# Patient Record
Sex: Female | Born: 1935 | Race: White | Hispanic: No | Smoking: Never smoker
Health system: Southern US, Community
[De-identification: ages and names within clinical notes are randomized; demographics above are authoritative.]

## PROBLEM LIST (undated history)

## (undated) DIAGNOSIS — D509 Iron deficiency anemia, unspecified: Secondary | ICD-10-CM

## (undated) DIAGNOSIS — Z9889 Other specified postprocedural states: Secondary | ICD-10-CM

## (undated) DIAGNOSIS — C801 Malignant (primary) neoplasm, unspecified: Secondary | ICD-10-CM

## (undated) DIAGNOSIS — R112 Nausea with vomiting, unspecified: Secondary | ICD-10-CM

## (undated) DIAGNOSIS — G629 Polyneuropathy, unspecified: Secondary | ICD-10-CM

## (undated) DIAGNOSIS — E538 Deficiency of other specified B group vitamins: Secondary | ICD-10-CM

## (undated) DIAGNOSIS — I10 Essential (primary) hypertension: Secondary | ICD-10-CM

## (undated) DIAGNOSIS — T8859XA Other complications of anesthesia, initial encounter: Secondary | ICD-10-CM

## (undated) DIAGNOSIS — E039 Hypothyroidism, unspecified: Secondary | ICD-10-CM

## (undated) DIAGNOSIS — T4145XA Adverse effect of unspecified anesthetic, initial encounter: Secondary | ICD-10-CM

## (undated) DIAGNOSIS — M1990 Unspecified osteoarthritis, unspecified site: Secondary | ICD-10-CM

## (undated) DIAGNOSIS — E0590 Thyrotoxicosis, unspecified without thyrotoxic crisis or storm: Secondary | ICD-10-CM

## (undated) HISTORY — PX: ABDOMINAL HYSTERECTOMY: SHX81

## (undated) HISTORY — PX: MENISCUS REPAIR: SHX5179

## (undated) HISTORY — PX: COLONOSCOPY: SHX174

## (undated) HISTORY — PX: TONSILLECTOMY: SUR1361

---

## 1999-02-08 ENCOUNTER — Encounter: Admission: RE | Admit: 1999-02-08 | Discharge: 1999-02-08 | Payer: Self-pay | Admitting: Gynecology

## 1999-02-08 ENCOUNTER — Encounter: Payer: Self-pay | Admitting: Gynecology

## 2000-07-03 ENCOUNTER — Encounter: Admission: RE | Admit: 2000-07-03 | Discharge: 2000-07-03 | Payer: Self-pay | Admitting: Gynecology

## 2000-07-03 ENCOUNTER — Encounter: Payer: Self-pay | Admitting: Gynecology

## 2000-07-09 ENCOUNTER — Encounter: Payer: Self-pay | Admitting: Gynecology

## 2000-07-09 ENCOUNTER — Encounter: Admission: RE | Admit: 2000-07-09 | Discharge: 2000-07-09 | Payer: Self-pay | Admitting: Gynecology

## 2000-09-03 ENCOUNTER — Other Ambulatory Visit: Admission: RE | Admit: 2000-09-03 | Discharge: 2000-09-03 | Payer: Self-pay | Admitting: Gynecology

## 2001-01-23 ENCOUNTER — Encounter: Admission: RE | Admit: 2001-01-23 | Discharge: 2001-01-23 | Payer: Self-pay | Admitting: Gynecology

## 2001-01-23 ENCOUNTER — Encounter: Payer: Self-pay | Admitting: Gynecology

## 2002-08-14 ENCOUNTER — Encounter: Payer: Self-pay | Admitting: Gynecology

## 2002-08-14 ENCOUNTER — Encounter: Admission: RE | Admit: 2002-08-14 | Discharge: 2002-08-14 | Payer: Self-pay | Admitting: Gynecology

## 2004-02-23 ENCOUNTER — Encounter: Admission: RE | Admit: 2004-02-23 | Discharge: 2004-02-23 | Payer: Self-pay | Admitting: Gynecology

## 2005-05-03 ENCOUNTER — Other Ambulatory Visit: Admission: RE | Admit: 2005-05-03 | Discharge: 2005-05-03 | Payer: Self-pay | Admitting: Gynecology

## 2010-05-19 ENCOUNTER — Other Ambulatory Visit (HOSPITAL_COMMUNITY): Payer: Self-pay | Admitting: Neurosurgery

## 2010-05-19 ENCOUNTER — Ambulatory Visit (HOSPITAL_COMMUNITY)
Admission: RE | Admit: 2010-05-19 | Discharge: 2010-05-19 | Disposition: A | Discharge status: Home / Self Care | Payer: Medicare Other | Source: Ambulatory Visit | Attending: Neurosurgery | Admitting: Neurosurgery

## 2010-05-19 ENCOUNTER — Encounter (HOSPITAL_COMMUNITY)
Admission: RE | Admit: 2010-05-19 | Discharge: 2010-05-19 | Disposition: A | Discharge status: Home / Self Care | Payer: Medicare Other | Source: Ambulatory Visit | Attending: Neurosurgery | Admitting: Neurosurgery

## 2010-05-19 DIAGNOSIS — M5126 Other intervertebral disc displacement, lumbar region: Secondary | ICD-10-CM

## 2010-05-19 DIAGNOSIS — J984 Other disorders of lung: Secondary | ICD-10-CM | POA: Insufficient documentation

## 2010-05-19 DIAGNOSIS — Z01812 Encounter for preprocedural laboratory examination: Secondary | ICD-10-CM | POA: Insufficient documentation

## 2010-05-19 DIAGNOSIS — Z01811 Encounter for preprocedural respiratory examination: Secondary | ICD-10-CM | POA: Insufficient documentation

## 2010-05-19 DIAGNOSIS — Z01818 Encounter for other preprocedural examination: Secondary | ICD-10-CM | POA: Insufficient documentation

## 2010-05-19 LAB — BASIC METABOLIC PANEL
BUN: 16 mg/dL (ref 6–23)
CO2: 30 mEq/L (ref 19–32)
Chloride: 99 mEq/L (ref 96–112)
GFR calc Af Amer: 55 mL/min — ABNORMAL LOW (ref >60)
Potassium: 4.8 mEq/L (ref 3.5–5.1)

## 2010-05-19 LAB — CBC
Hemoglobin: 12.4 g/dL (ref 12.0–15.0)
MCV: 93.1 fL (ref 78.0–100.0)
Platelets: 289 10*3/uL (ref 150–400)
RBC: 4.05 MIL/uL (ref 3.87–5.11)
WBC: 5 10*3/uL (ref 4.0–10.5)

## 2010-05-19 LAB — SURGICAL PCR SCREEN
MRSA, PCR: NEGATIVE
Staphylococcus aureus: NEGATIVE

## 2010-05-24 ENCOUNTER — Inpatient Hospital Stay (HOSPITAL_COMMUNITY): Payer: Medicare Other

## 2010-05-24 ENCOUNTER — Inpatient Hospital Stay (HOSPITAL_COMMUNITY)
Admission: RE | Admit: 2010-05-24 | Discharge: 2010-05-25 | DRG: 491 | Disposition: A | Discharge status: Home / Self Care | Payer: Medicare Other | Source: Ambulatory Visit | Attending: Neurosurgery | Admitting: Neurosurgery

## 2010-05-24 DIAGNOSIS — M5126 Other intervertebral disc displacement, lumbar region: Principal | ICD-10-CM | POA: Diagnosis present

## 2010-05-24 DIAGNOSIS — E039 Hypothyroidism, unspecified: Secondary | ICD-10-CM | POA: Diagnosis present

## 2010-07-05 NOTE — Op Note (Signed)
[name], Patricia Mason                ACCOUNT NO.:  [id number]  MEDICAL RECORD NO.:  [id number]           PATIENT TYPE:  I  LOCATION:  3535                         FACILITY:  [facility]  PHYSICIAN:  Donalee Citrin, M.D.        DATE OF BIRTH:  1930-09-02  DATE OF PROCEDURE:  05/24/2010 DATE OF DISCHARGE:  05/25/2010                              OPERATIVE REPORT   PREOPERATIVE DIAGNOSIS:  Right L5 radiculopathy from ruptured disk at L4- 5, right.  PROCEDURE:  Lumbar laminectomy and microdiskectomy at L4-5, right; microscopic dissection of the right L5 nerve root and microscopic diskectomy.  SURGEON:  Donalee Citrin, MD  ANESTHESIA:  General endotracheal.  HISTORY OF PRESENT ILLNESS:  The patient is a very pleasant 77 year old female who has had progressive worsening back and right leg pain, radiating down the back of her leg and big toe consistent with an L5 nerve pattern.  MRI scan showed a large ruptured disk at L4-5 displacing the right L5 nerve root.  Due to the patient's failure of conservative treatment, the patient was recommended laminectomy and microdiskectomy. The risks and benefits of the operation were explained to the patient. The patient understood and agreed to proceed forward.  The patient was brought to the OR, induced general anesthesia, positioned prone on the Wilson frame.  Back was prepped and draped in the usual sterile fashion.  Preop x-rays localized the appropriate level.  So after infiltration of 10 mL of lidocaine with epinephrine, a midline incision was made and Bovie electrocautery was used to take down to the subcutaneous tissues.  Subperiosteal dissection was carried to lamina of L4-L5 on the right side.  Intraoperative x-ray identified the appropriate level, so the inferior aspect to the L4 medial facet complex and superior aspect of L5 was drilled down.  The undersurface of the lamina and medial facet complex were underbitten with a 2- to 3-mm Kerrison punch  exposing the ligamentum flavum, which was noted to be markedly hypertrophied.  This was all removed in a piecemeal fashion exposing thecal sac and the operative microscope was draped and brought into the field under microscope illumination.  The undersurface of the medial __________ was further underbitten to gain access to the proximal L5 nerve root.  The L5 nerve was then dissected off the disk space. There was a large disk fragment immediately appreciated __________ contained within the ligament and this was all removed in piecemeal fashion.  Disk space was __________.  Disk space was cleaned out decompressing the central canal as well as the L5 nerve root.  At the end of the diskectomy, there was no further stenosis.  The wound was copiously irrigated and good hemostasis was maintained.  The foramina was re-explored with an angled nerve hook and hockey stick and noted to be widely patent.  Then, Gelfoam was laid on top of the dura.  The muscle fascia was reapproximated in layers with interrupted Vicryl and skin was closed with running 4-0 subcuticular. Benzoin and Steri-Strips were applied.  The patient went to recovery room in stable condition.          ______________________________  Donalee Citrin, M.D.     GC/MEDQ  D:  06/22/2010  T:  06/23/2010  Job:  [id number]  Electronically Signed by Donalee Citrin M.D. on 07/05/2010 05:16:48 PM

## 2010-07-05 NOTE — Discharge Summary (Signed)
  Patricia Mason, Patricia Mason                ACCOUNT NO.:  [id number]  MEDICAL RECORD NO.:  [id number]           PATIENT TYPE:  I  LOCATION:  3535                         FACILITY:  [facility]  PHYSICIAN:  Donalee Citrin, M.D.        DATE OF BIRTH:  December 03, 1930  DATE OF ADMISSION:  05/24/2010 DATE OF DISCHARGE:  05/25/2010                              DISCHARGE SUMMARY   ADMITTING DIAGNOSIS:  Lumbar radiculopathy from ruptured disk at L4-5.  PROCEDURE:  Lumbar laminectomy and microdiskectomy at L4-5 and microscopic dissection of the left L5 nerve root.  HOSPITAL COURSE:  The patient was admitted as an EMA, went to the operating room and underwent the aforementioned procedure. Postoperatively, the patient did very well and went to recovery room floor.  On the floor, the patient convalesced well and was ambulating and voiding spontaneously.  On postop day #1, wound was clean and dry. She was tolerating pills for pain and she was significantly improved from preop, so the patient was able to be discharged home with scheduled followup in approximately 2 weeks.          ______________________________ Donalee Citrin, M.D.     GC/MEDQ  D:  06/22/2010  T:  06/23/2010  Job:  [id number]  Electronically Signed by Donalee Citrin M.D. on 07/05/2010 05:16:46 PM

## 2012-05-09 IMAGING — CR DG CHEST 2V
2 series · 2 of 2 positions shown · non-contrast
Comparison: None.

CLINICAL DATA: Preoperative respiratory evaluation.

CHEST - 2 VIEW

[view not recorded (1 of 2)]
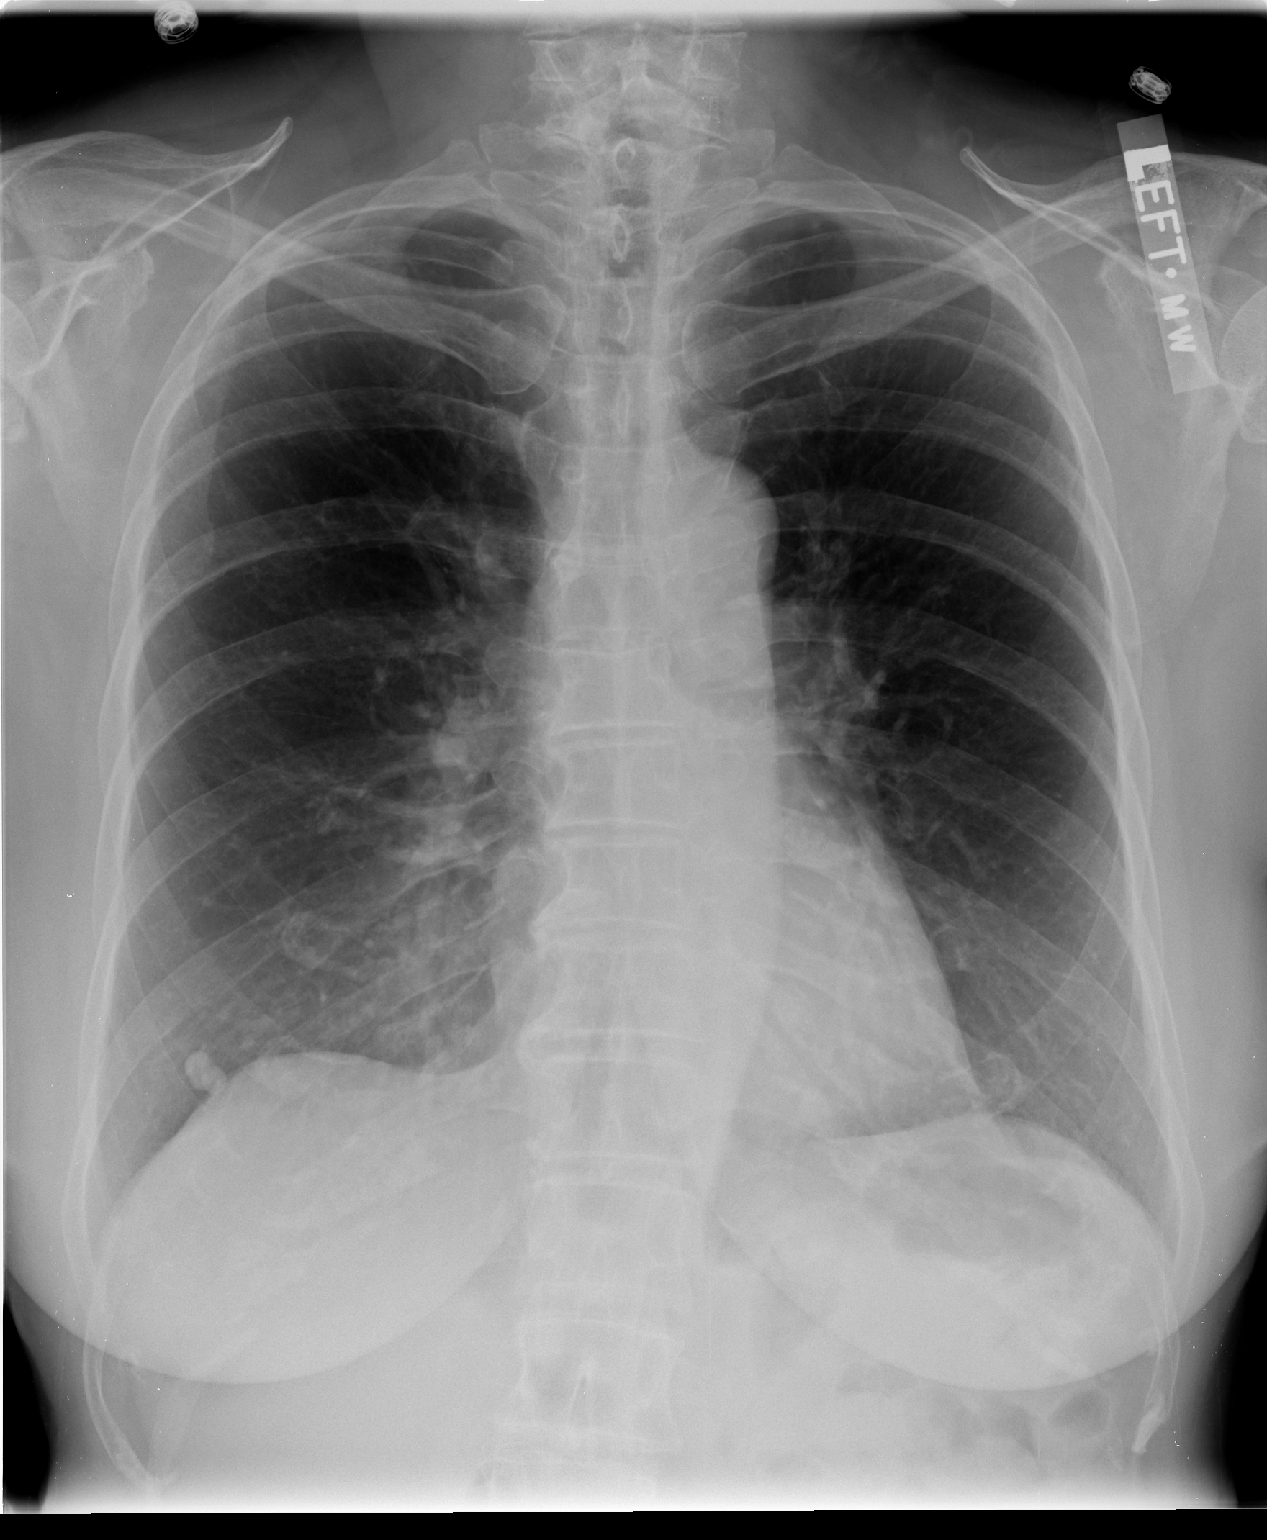

[view not recorded (2 of 2)]
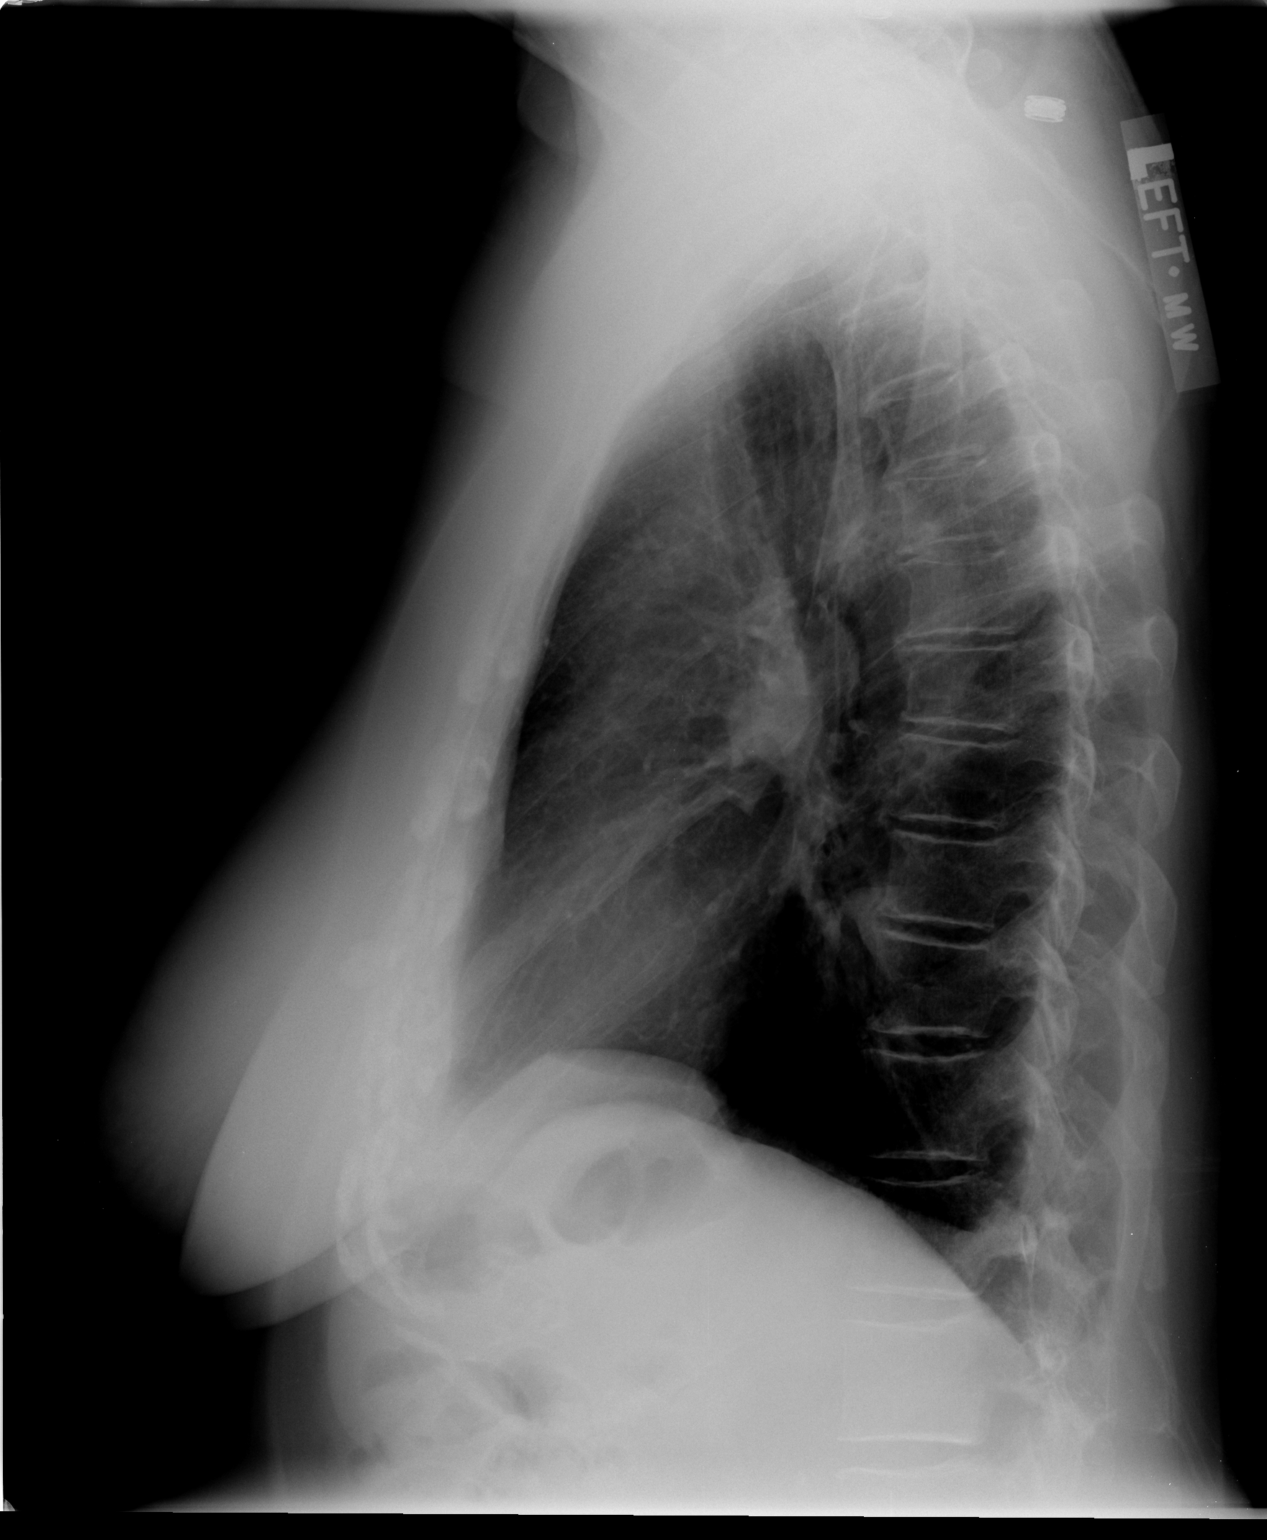

[2 of 2 positions shown; findings below may reference images not displayed]

FINDINGS: The lungs are mildly hyperexpanded.  There is no focal
airspace consolidation or pulmonary edema.  Round calcified nodules
overlying the right lung base on the frontal film are seen to
reside within the tissue of the right breast on the lateral film.
The cardiopericardial silhouette is within normal limits for size.
Imaged bony structures of the thorax are intact.
IMPRESSION: No acute cardiopulmonary findings.

## 2013-12-10 ENCOUNTER — Ambulatory Visit (HOSPITAL_COMMUNITY)
Admission: RE | Admit: 2013-12-10 | Discharge: 2013-12-10 | Disposition: A | Discharge status: Home / Self Care | Payer: Medicare Other | Source: Ambulatory Visit | Attending: Vascular Surgery | Admitting: Vascular Surgery

## 2013-12-10 ENCOUNTER — Other Ambulatory Visit (HOSPITAL_COMMUNITY): Payer: Self-pay | Admitting: Neurosurgery

## 2013-12-10 DIAGNOSIS — M79606 Pain in leg, unspecified: Secondary | ICD-10-CM

## 2013-12-10 DIAGNOSIS — R209 Unspecified disturbances of skin sensation: Secondary | ICD-10-CM | POA: Diagnosis not present

## 2013-12-10 DIAGNOSIS — M79609 Pain in unspecified limb: Secondary | ICD-10-CM | POA: Insufficient documentation

## 2015-04-19 DIAGNOSIS — H25813 Combined forms of age-related cataract, bilateral: Secondary | ICD-10-CM | POA: Diagnosis not present

## 2015-06-16 DIAGNOSIS — M170 Bilateral primary osteoarthritis of knee: Secondary | ICD-10-CM | POA: Diagnosis not present

## 2015-08-30 DIAGNOSIS — G629 Polyneuropathy, unspecified: Secondary | ICD-10-CM | POA: Diagnosis not present

## 2015-08-30 DIAGNOSIS — Z79899 Other long term (current) drug therapy: Secondary | ICD-10-CM | POA: Diagnosis not present

## 2015-08-30 DIAGNOSIS — E039 Hypothyroidism, unspecified: Secondary | ICD-10-CM | POA: Diagnosis not present

## 2015-08-30 DIAGNOSIS — I10 Essential (primary) hypertension: Secondary | ICD-10-CM | POA: Diagnosis not present

## 2015-09-13 DIAGNOSIS — R42 Dizziness and giddiness: Secondary | ICD-10-CM | POA: Diagnosis not present

## 2015-09-13 DIAGNOSIS — R070 Pain in throat: Secondary | ICD-10-CM | POA: Diagnosis not present

## 2015-09-13 DIAGNOSIS — R0982 Postnasal drip: Secondary | ICD-10-CM | POA: Diagnosis not present

## 2015-09-13 DIAGNOSIS — H9319 Tinnitus, unspecified ear: Secondary | ICD-10-CM | POA: Diagnosis not present

## 2015-09-23 DIAGNOSIS — G608 Other hereditary and idiopathic neuropathies: Secondary | ICD-10-CM | POA: Diagnosis not present

## 2015-10-17 DIAGNOSIS — H353131 Nonexudative age-related macular degeneration, bilateral, early dry stage: Secondary | ICD-10-CM | POA: Diagnosis not present

## 2015-10-17 DIAGNOSIS — H5203 Hypermetropia, bilateral: Secondary | ICD-10-CM | POA: Diagnosis not present

## 2015-10-17 DIAGNOSIS — H52223 Regular astigmatism, bilateral: Secondary | ICD-10-CM | POA: Diagnosis not present

## 2015-10-17 DIAGNOSIS — H524 Presbyopia: Secondary | ICD-10-CM | POA: Diagnosis not present

## 2015-10-17 DIAGNOSIS — H25813 Combined forms of age-related cataract, bilateral: Secondary | ICD-10-CM | POA: Diagnosis not present

## 2015-10-17 DIAGNOSIS — H35342 Macular cyst, hole, or pseudohole, left eye: Secondary | ICD-10-CM | POA: Diagnosis not present

## 2015-10-17 DIAGNOSIS — I10 Essential (primary) hypertension: Secondary | ICD-10-CM | POA: Diagnosis not present

## 2015-10-24 DIAGNOSIS — G8929 Other chronic pain: Secondary | ICD-10-CM | POA: Diagnosis not present

## 2015-10-24 DIAGNOSIS — M25561 Pain in right knee: Secondary | ICD-10-CM | POA: Diagnosis not present

## 2015-10-24 DIAGNOSIS — M25562 Pain in left knee: Secondary | ICD-10-CM | POA: Diagnosis not present

## 2015-12-13 DIAGNOSIS — L570 Actinic keratosis: Secondary | ICD-10-CM | POA: Diagnosis not present

## 2015-12-15 DIAGNOSIS — Z79899 Other long term (current) drug therapy: Secondary | ICD-10-CM | POA: Diagnosis not present

## 2015-12-16 DIAGNOSIS — Z1231 Encounter for screening mammogram for malignant neoplasm of breast: Secondary | ICD-10-CM | POA: Diagnosis not present

## 2015-12-30 DIAGNOSIS — H25811 Combined forms of age-related cataract, right eye: Secondary | ICD-10-CM | POA: Diagnosis not present

## 2015-12-30 DIAGNOSIS — H35372 Puckering of macula, left eye: Secondary | ICD-10-CM | POA: Diagnosis not present

## 2016-01-02 DIAGNOSIS — G608 Other hereditary and idiopathic neuropathies: Secondary | ICD-10-CM | POA: Diagnosis not present

## 2016-01-31 DIAGNOSIS — H25811 Combined forms of age-related cataract, right eye: Secondary | ICD-10-CM | POA: Diagnosis not present

## 2016-01-31 DIAGNOSIS — Z79899 Other long term (current) drug therapy: Secondary | ICD-10-CM | POA: Diagnosis not present

## 2016-01-31 DIAGNOSIS — Z8679 Personal history of other diseases of the circulatory system: Secondary | ICD-10-CM | POA: Diagnosis not present

## 2016-01-31 DIAGNOSIS — Z961 Presence of intraocular lens: Secondary | ICD-10-CM | POA: Diagnosis not present

## 2016-01-31 DIAGNOSIS — Z7982 Long term (current) use of aspirin: Secondary | ICD-10-CM | POA: Diagnosis not present

## 2016-01-31 DIAGNOSIS — I10 Essential (primary) hypertension: Secondary | ICD-10-CM | POA: Diagnosis not present

## 2016-01-31 DIAGNOSIS — H20011 Primary iridocyclitis, right eye: Secondary | ICD-10-CM | POA: Diagnosis not present

## 2016-01-31 DIAGNOSIS — E039 Hypothyroidism, unspecified: Secondary | ICD-10-CM | POA: Diagnosis not present

## 2016-01-31 DIAGNOSIS — H18231 Secondary corneal edema, right eye: Secondary | ICD-10-CM | POA: Diagnosis not present

## 2016-02-28 DIAGNOSIS — I10 Essential (primary) hypertension: Secondary | ICD-10-CM | POA: Diagnosis not present

## 2016-02-28 DIAGNOSIS — Z961 Presence of intraocular lens: Secondary | ICD-10-CM | POA: Diagnosis not present

## 2016-02-28 DIAGNOSIS — E039 Hypothyroidism, unspecified: Secondary | ICD-10-CM | POA: Diagnosis not present

## 2016-02-28 DIAGNOSIS — Z79899 Other long term (current) drug therapy: Secondary | ICD-10-CM | POA: Diagnosis not present

## 2016-02-28 DIAGNOSIS — Z9841 Cataract extraction status, right eye: Secondary | ICD-10-CM | POA: Diagnosis not present

## 2016-02-28 DIAGNOSIS — Z9842 Cataract extraction status, left eye: Secondary | ICD-10-CM | POA: Diagnosis not present

## 2016-02-28 DIAGNOSIS — Z7982 Long term (current) use of aspirin: Secondary | ICD-10-CM | POA: Diagnosis not present

## 2016-02-28 DIAGNOSIS — H25811 Combined forms of age-related cataract, right eye: Secondary | ICD-10-CM | POA: Diagnosis not present

## 2016-02-28 DIAGNOSIS — G629 Polyneuropathy, unspecified: Secondary | ICD-10-CM | POA: Diagnosis not present

## 2016-02-28 DIAGNOSIS — H25812 Combined forms of age-related cataract, left eye: Secondary | ICD-10-CM | POA: Diagnosis not present

## 2016-03-14 DIAGNOSIS — L820 Inflamed seborrheic keratosis: Secondary | ICD-10-CM | POA: Diagnosis not present

## 2016-03-19 HISTORY — PX: SQUAMOUS CELL CARCINOMA EXCISION: SHX2433

## 2016-03-19 HISTORY — PX: EYE SURGERY: SHX253

## 2016-05-10 DIAGNOSIS — C44622 Squamous cell carcinoma of skin of right upper limb, including shoulder: Secondary | ICD-10-CM | POA: Diagnosis not present

## 2016-05-21 DIAGNOSIS — J40 Bronchitis, not specified as acute or chronic: Secondary | ICD-10-CM | POA: Diagnosis not present

## 2016-05-21 DIAGNOSIS — G629 Polyneuropathy, unspecified: Secondary | ICD-10-CM | POA: Diagnosis not present

## 2016-05-21 DIAGNOSIS — E039 Hypothyroidism, unspecified: Secondary | ICD-10-CM | POA: Diagnosis not present

## 2016-05-21 DIAGNOSIS — I10 Essential (primary) hypertension: Secondary | ICD-10-CM | POA: Diagnosis not present

## 2016-05-21 DIAGNOSIS — Z79899 Other long term (current) drug therapy: Secondary | ICD-10-CM | POA: Diagnosis not present

## 2016-05-25 DIAGNOSIS — J22 Unspecified acute lower respiratory infection: Secondary | ICD-10-CM | POA: Diagnosis not present

## 2016-06-12 DIAGNOSIS — D485 Neoplasm of uncertain behavior of skin: Secondary | ICD-10-CM | POA: Diagnosis not present

## 2016-06-12 DIAGNOSIS — C44622 Squamous cell carcinoma of skin of right upper limb, including shoulder: Secondary | ICD-10-CM | POA: Diagnosis not present

## 2016-07-09 DIAGNOSIS — G608 Other hereditary and idiopathic neuropathies: Secondary | ICD-10-CM | POA: Diagnosis not present

## 2016-07-11 DIAGNOSIS — C44622 Squamous cell carcinoma of skin of right upper limb, including shoulder: Secondary | ICD-10-CM | POA: Diagnosis not present

## 2016-07-30 DIAGNOSIS — G8929 Other chronic pain: Secondary | ICD-10-CM | POA: Diagnosis not present

## 2016-07-30 DIAGNOSIS — M170 Bilateral primary osteoarthritis of knee: Secondary | ICD-10-CM | POA: Diagnosis not present

## 2016-09-04 DIAGNOSIS — L570 Actinic keratosis: Secondary | ICD-10-CM | POA: Diagnosis not present

## 2016-11-21 DIAGNOSIS — E039 Hypothyroidism, unspecified: Secondary | ICD-10-CM | POA: Diagnosis not present

## 2016-11-21 DIAGNOSIS — I10 Essential (primary) hypertension: Secondary | ICD-10-CM | POA: Diagnosis not present

## 2016-11-21 DIAGNOSIS — R7309 Other abnormal glucose: Secondary | ICD-10-CM | POA: Diagnosis not present

## 2016-11-21 DIAGNOSIS — R413 Other amnesia: Secondary | ICD-10-CM | POA: Diagnosis not present

## 2016-11-21 DIAGNOSIS — Z79899 Other long term (current) drug therapy: Secondary | ICD-10-CM | POA: Diagnosis not present

## 2016-11-21 DIAGNOSIS — G629 Polyneuropathy, unspecified: Secondary | ICD-10-CM | POA: Diagnosis not present

## 2017-01-02 DIAGNOSIS — R413 Other amnesia: Secondary | ICD-10-CM | POA: Diagnosis not present

## 2017-01-02 DIAGNOSIS — F5101 Primary insomnia: Secondary | ICD-10-CM | POA: Diagnosis not present

## 2017-01-29 DIAGNOSIS — G8929 Other chronic pain: Secondary | ICD-10-CM | POA: Diagnosis not present

## 2017-01-29 DIAGNOSIS — M170 Bilateral primary osteoarthritis of knee: Secondary | ICD-10-CM | POA: Diagnosis not present

## 2017-02-05 DIAGNOSIS — L570 Actinic keratosis: Secondary | ICD-10-CM | POA: Diagnosis not present

## 2017-02-20 DIAGNOSIS — Z9841 Cataract extraction status, right eye: Secondary | ICD-10-CM | POA: Diagnosis not present

## 2017-02-20 DIAGNOSIS — H353131 Nonexudative age-related macular degeneration, bilateral, early dry stage: Secondary | ICD-10-CM | POA: Diagnosis not present

## 2017-02-20 DIAGNOSIS — Z961 Presence of intraocular lens: Secondary | ICD-10-CM | POA: Diagnosis not present

## 2017-02-20 DIAGNOSIS — H52223 Regular astigmatism, bilateral: Secondary | ICD-10-CM | POA: Diagnosis not present

## 2017-02-20 DIAGNOSIS — H524 Presbyopia: Secondary | ICD-10-CM | POA: Diagnosis not present

## 2017-02-20 DIAGNOSIS — H26492 Other secondary cataract, left eye: Secondary | ICD-10-CM | POA: Diagnosis not present

## 2017-02-20 DIAGNOSIS — I10 Essential (primary) hypertension: Secondary | ICD-10-CM | POA: Diagnosis not present

## 2017-05-02 DIAGNOSIS — L570 Actinic keratosis: Secondary | ICD-10-CM | POA: Diagnosis not present

## 2017-05-07 DIAGNOSIS — M7989 Other specified soft tissue disorders: Secondary | ICD-10-CM | POA: Diagnosis not present

## 2017-05-07 DIAGNOSIS — S6991XA Unspecified injury of right wrist, hand and finger(s), initial encounter: Secondary | ICD-10-CM | POA: Diagnosis not present

## 2017-05-07 DIAGNOSIS — I10 Essential (primary) hypertension: Secondary | ICD-10-CM | POA: Diagnosis not present

## 2017-05-07 DIAGNOSIS — F5101 Primary insomnia: Secondary | ICD-10-CM | POA: Diagnosis not present

## 2017-05-07 DIAGNOSIS — M25531 Pain in right wrist: Secondary | ICD-10-CM | POA: Diagnosis not present

## 2017-05-07 DIAGNOSIS — S63004A Unspecified dislocation of right wrist and hand, initial encounter: Secondary | ICD-10-CM | POA: Diagnosis not present

## 2017-05-07 DIAGNOSIS — E039 Hypothyroidism, unspecified: Secondary | ICD-10-CM | POA: Diagnosis not present

## 2017-05-07 DIAGNOSIS — G629 Polyneuropathy, unspecified: Secondary | ICD-10-CM | POA: Diagnosis not present

## 2017-05-07 DIAGNOSIS — Z1231 Encounter for screening mammogram for malignant neoplasm of breast: Secondary | ICD-10-CM | POA: Diagnosis not present

## 2017-05-28 DIAGNOSIS — L821 Other seborrheic keratosis: Secondary | ICD-10-CM | POA: Diagnosis not present

## 2017-05-28 DIAGNOSIS — D485 Neoplasm of uncertain behavior of skin: Secondary | ICD-10-CM | POA: Diagnosis not present

## 2017-07-11 DIAGNOSIS — M25562 Pain in left knee: Secondary | ICD-10-CM | POA: Diagnosis not present

## 2017-07-11 DIAGNOSIS — M170 Bilateral primary osteoarthritis of knee: Secondary | ICD-10-CM | POA: Diagnosis not present

## 2017-07-11 DIAGNOSIS — G8929 Other chronic pain: Secondary | ICD-10-CM | POA: Diagnosis not present

## 2017-07-11 DIAGNOSIS — M25561 Pain in right knee: Secondary | ICD-10-CM | POA: Diagnosis not present

## 2017-10-08 DIAGNOSIS — R7309 Other abnormal glucose: Secondary | ICD-10-CM | POA: Diagnosis not present

## 2017-10-08 DIAGNOSIS — Z1231 Encounter for screening mammogram for malignant neoplasm of breast: Secondary | ICD-10-CM | POA: Diagnosis not present

## 2017-10-08 DIAGNOSIS — I10 Essential (primary) hypertension: Secondary | ICD-10-CM | POA: Diagnosis not present

## 2017-10-08 DIAGNOSIS — Z01818 Encounter for other preprocedural examination: Secondary | ICD-10-CM | POA: Diagnosis not present

## 2017-10-14 ENCOUNTER — Other Ambulatory Visit: Payer: Self-pay | Admitting: Orthopedic Surgery

## 2017-12-04 ENCOUNTER — Encounter (HOSPITAL_COMMUNITY): Payer: Self-pay | Admitting: *Deleted

## 2017-12-04 ENCOUNTER — Other Ambulatory Visit: Payer: Self-pay

## 2017-12-04 ENCOUNTER — Encounter (HOSPITAL_COMMUNITY)
Admission: RE | Admit: 2017-12-04 | Discharge: 2017-12-04 | Disposition: A | Discharge status: Home / Self Care | Payer: PPO | Source: Ambulatory Visit | Attending: Orthopedic Surgery | Admitting: Orthopedic Surgery

## 2017-12-04 DIAGNOSIS — Z01812 Encounter for preprocedural laboratory examination: Secondary | ICD-10-CM | POA: Insufficient documentation

## 2017-12-04 DIAGNOSIS — I10 Essential (primary) hypertension: Secondary | ICD-10-CM | POA: Diagnosis not present

## 2017-12-04 DIAGNOSIS — Z01810 Encounter for preprocedural cardiovascular examination: Secondary | ICD-10-CM | POA: Insufficient documentation

## 2017-12-04 DIAGNOSIS — M1711 Unilateral primary osteoarthritis, right knee: Secondary | ICD-10-CM | POA: Insufficient documentation

## 2017-12-04 HISTORY — DX: Essential (primary) hypertension: I10

## 2017-12-04 HISTORY — DX: Other specified postprocedural states: R11.2

## 2017-12-04 HISTORY — DX: Malignant (primary) neoplasm, unspecified: C80.1

## 2017-12-04 HISTORY — DX: Other complications of anesthesia, initial encounter: T88.59XA

## 2017-12-04 HISTORY — DX: Thyrotoxicosis, unspecified without thyrotoxic crisis or storm: E05.90

## 2017-12-04 HISTORY — DX: Adverse effect of unspecified anesthetic, initial encounter: T41.45XA

## 2017-12-04 HISTORY — DX: Other specified postprocedural states: Z98.890

## 2017-12-04 LAB — COMPREHENSIVE METABOLIC PANEL
ALK PHOS: 47 U/L (ref 38–126)
ALT: 11 U/L (ref 0–44)
ANION GAP: 8 (ref 5–15)
AST: 17 U/L (ref 15–41)
Albumin: 4.2 g/dL (ref 3.5–5.0)
BUN: 15 mg/dL (ref 8–23)
CALCIUM: 9.6 mg/dL (ref 8.9–10.3)
CO2: 32 mmol/L (ref 22–32)
Chloride: 101 mmol/L (ref 98–111)
Creatinine, Ser: 0.99 mg/dL (ref 0.44–1.00)
GFR, EST AFRICAN AMERICAN: 59 mL/min — AB (ref >60)
GFR, EST NON AFRICAN AMERICAN: 51 mL/min — AB (ref >60)
Glucose, Bld: 105 mg/dL — ABNORMAL HIGH (ref 70–99)
Potassium: 4.4 mmol/L (ref 3.5–5.1)
Sodium: 141 mmol/L (ref 135–145)
Total Bilirubin: 1.3 mg/dL — ABNORMAL HIGH (ref 0.3–1.2)
Total Protein: 7 g/dL (ref 6.5–8.1)

## 2017-12-04 LAB — CBC WITH DIFFERENTIAL/PLATELET
Basophils Absolute: 0 10*3/uL (ref 0.0–0.1)
Basophils Relative: 1 %
EOS ABS: 0.1 10*3/uL (ref 0.0–0.7)
Eosinophils Relative: 2 %
HEMATOCRIT: 40.3 % (ref 36.0–46.0)
HEMOGLOBIN: 12.9 g/dL (ref 12.0–15.0)
LYMPHS ABS: 1.8 10*3/uL (ref 0.7–4.0)
Lymphocytes Relative: 38 %
MCH: 31.4 pg (ref 26.0–34.0)
MCHC: 32 g/dL (ref 30.0–36.0)
MCV: 98.1 fL (ref 78.0–100.0)
MONOS PCT: 8 %
Monocytes Absolute: 0.4 10*3/uL (ref 0.1–1.0)
NEUTROS ABS: 2.5 10*3/uL (ref 1.7–7.7)
NEUTROS PCT: 51 %
Platelets: 259 10*3/uL (ref 150–400)
RBC: 4.11 MIL/uL (ref 3.87–5.11)
RDW: 12.6 % (ref 11.5–15.5)
WBC: 4.8 10*3/uL (ref 4.0–10.5)

## 2017-12-04 LAB — SURGICAL PCR SCREEN
MRSA, PCR: NEGATIVE
STAPHYLOCOCCUS AUREUS: NEGATIVE

## 2017-12-04 NOTE — Patient Instructions (Signed)
Patricia Mason  12/04/2017   Your procedure is scheduled on: 12-09-17   Report to [facility] Main  Entrance    Report to Admitting at 5:30 AM    Call this number if you have problems the morning of surgery [phone]   Remember: Do not eat food or drink liquids :After Midnight.    BRUSH YOUR TEETH MORNING OF SURGERY AND RINSE YOUR MOUTH OUT, NO CHEWING GUM CANDY OR MINTS.     Take these medicines the morning of surgery with A SIP OF WATER: Gabapentin (Neurontin) and Levothyroxine (Synthroid)                                You may not have any metal on your body including hair pins and              piercings  Do not wear jewelry, make-up, lotions, powders or perfumes, deodorant             Do not wear nail polish.  Do not shave  48 hours prior to surgery.               Do not bring valuables to the hospital. [place].  Contacts, dentures or bridgework may not be worn into surgery.  Leave suitcase in the car. After surgery it may be brought to your room.      Special Instructions: N/A              Please read over the following fact sheets you were given: _____________________________________________________________________             [facility] - Preparing for Surgery Before surgery, you can play an important role.  Because skin is not sterile, your skin needs to be as free of germs as possible.  You can reduce the number of germs on your skin by washing with CHG (chlorahexidine gluconate) soap before surgery.  CHG is an antiseptic cleaner which kills germs and bonds with the skin to continue killing germs even after washing. Please DO NOT use if you have an allergy to CHG or antibacterial soaps.  If your skin becomes reddened/irritated stop using the CHG and inform your nurse when you arrive at Short Stay. Do not shave (including legs and underarms) for at least 48 hours prior to the first CHG  shower.  You may shave your face/neck. Please follow these instructions carefully:  1.  Shower with CHG Soap the night before surgery and the  morning of Surgery.  2.  If you choose to wash your hair, wash your hair first as usual with your  normal  shampoo.  3.  After you shampoo, rinse your hair and body thoroughly to remove the  shampoo.                           4.  Use CHG as you would any other liquid soap.  You can apply chg directly  to the skin and wash                       Gently with a scrungie or clean washcloth.  5.  Apply the CHG Soap to  your body ONLY FROM THE NECK DOWN.   Do not use on face/ open                           Wound or open sores. Avoid contact with eyes, ears mouth and genitals (private parts).                       Wash face,  Genitals (private parts) with your normal soap.             6.  Wash thoroughly, paying special attention to the area where your surgery  will be performed.  7.  Thoroughly rinse your body with warm water from the neck down.  8.  DO NOT shower/wash with your normal soap after using and rinsing off  the CHG Soap.                9.  Pat yourself dry with a clean towel.            10.  Wear clean pajamas.            11.  Place clean sheets on your bed the night of your first shower and do not  sleep with pets. Day of Surgery : Do not apply any lotions/deodorants the morning of surgery.  Please wear clean clothes to the hospital/surgery center.  FAILURE TO FOLLOW THESE INSTRUCTIONS MAY RESULT IN THE CANCELLATION OF YOUR SURGERY PATIENT SIGNATURE_________________________________  NURSE SIGNATURE__________________________________  ________________________________________________________________________   [name]  An incentive spirometer is a tool that can help keep your lungs clear and active. This tool measures how well you are filling your lungs with each breath. Taking long deep breaths may help reverse or decrease the chance  of developing breathing (pulmonary) problems (especially infection) following:  A long period of time when you are unable to move or be active. BEFORE THE PROCEDURE   If the spirometer includes an indicator to show your best effort, your nurse or respiratory therapist will set it to a desired goal.  If possible, sit up straight or lean slightly forward. Try not to slouch.  Hold the incentive spirometer in an upright position. INSTRUCTIONS FOR USE  1. Sit on the edge of your bed if possible, or sit up as far as you can in bed or on a chair. 2. Hold the incentive spirometer in an upright position. 3. Breathe out normally. 4. Place the mouthpiece in your mouth and seal your lips tightly around it. 5. Breathe in slowly and as deeply as possible, raising the piston or the ball toward the top of the column. 6. Hold your breath for 3-5 seconds or for as long as possible. Allow the piston or ball to fall to the bottom of the column. 7. Remove the mouthpiece from your mouth and breathe out normally. 8. Rest for a few seconds and repeat Steps 1 through 7 at least 10 times every 1-2 hours when you are awake. Take your time and take a few normal breaths between deep breaths. 9. The spirometer may include an indicator to show your best effort. Use the indicator as a goal to work toward during each repetition. 10. After each set of 10 deep breaths, practice coughing to be sure your lungs are clear. If you have an incision (the cut made at the time of surgery), support your incision when coughing by placing a pillow or rolled up towels firmly against  it. Once you are able to get out of bed, walk around indoors and cough well. You may stop using the incentive spirometer when instructed by your caregiver.  RISKS AND COMPLICATIONS  Take your time so you do not get dizzy or light-headed.  If you are in pain, you may need to take or ask for pain medication before doing incentive spirometry. It is harder to  take a deep breath if you are having pain. AFTER USE  Rest and breathe slowly and easily.  It can be helpful to keep track of a log of your progress. Your caregiver can provide you with a simple table to help with this. If you are using the spirometer at home, follow these instructions: [place] IF:   You are having difficultly using the spirometer.  You have trouble using the spirometer as often as instructed.  Your pain medication is not giving enough relief while using the spirometer.  You develop fever of 100.5 F (38.1 C) or higher. SEEK IMMEDIATE MEDICAL CARE IF:   You cough up bloody sputum that had not been present before.  You develop fever of 102 F (38.9 C) or greater.  You develop worsening pain at or near the incision site. MAKE SURE YOU:   Understand these instructions.  Will watch your condition.  Will get help right away if you are not doing well or get worse. Document Released: 07/16/2006 Document Revised: 05/28/2011 Document Reviewed: 09/16/2006 [facility] Patient Information 2014 [place], [place].   ________________________________________________________________________

## 2017-12-08 MED ORDER — BUPIVACAINE LIPOSOME 1.3 % IJ SUSP
20.0000 mL | INTRAMUSCULAR | Status: DC
  Filled 2017-12-08: qty 20

## 2017-12-08 NOTE — Anesthesia Preprocedure Evaluation (Addendum)
Anesthesia Evaluation  Patient identified by MRN, date of birth, ID band Patient awake    Reviewed: Allergy & Precautions, H&P , NPO status , Patient's Chart, lab work & pertinent test results, reviewed documented beta blocker date and time   History of Anesthesia Complications (+) PONV and history of anesthetic complications  Airway Mallampati: II  TM Distance: >3 FB Neck ROM: full    Dental no notable dental hx. (+) Edentulous Lower, Lower Dentures   Pulmonary neg pulmonary ROS,    Pulmonary exam normal breath sounds clear to auscultation       Cardiovascular Exercise Tolerance: Good hypertension, Pt. on medications  Rhythm:regular Rate:Normal     Neuro/Psych negative neurological ROS  negative psych ROS   GI/Hepatic negative GI ROS, Neg liver ROS,   Endo/Other  Hyperthyroidism   Renal/GU negative Renal ROS  negative genitourinary   Musculoskeletal   Abdominal   Peds  Hematology negative hematology ROS (+)   Anesthesia Other Findings   Reproductive/Obstetrics negative OB ROS                            Anesthesia Physical Anesthesia Plan  ASA: II  Anesthesia Plan: Spinal and MAC   Post-op Pain Management:  Regional for Post-op pain   Induction:   PONV Risk Score and Plan: 4 or greater and Ondansetron and Treatment may vary due to age or medical condition  Airway Management Planned: Nasal Cannula, Mask and Natural Airway  Additional Equipment:   Intra-op Plan:   Post-operative Plan:   Informed Consent: I have reviewed the patients History and Physical, chart, labs and discussed the procedure including the risks, benefits and alternatives for the proposed anesthesia with the patient or authorized representative who has indicated his/her understanding and acceptance.   Dental Advisory Given  Plan Discussed with: CRNA, Anesthesiologist and Surgeon  Anesthesia Plan  Comments: (  )        Anesthesia Quick Evaluation

## 2017-12-09 ENCOUNTER — Ambulatory Visit (HOSPITAL_COMMUNITY): Payer: PPO | Admitting: Anesthesiology

## 2017-12-09 ENCOUNTER — Encounter (HOSPITAL_COMMUNITY): Payer: Self-pay | Admitting: *Deleted

## 2017-12-09 ENCOUNTER — Other Ambulatory Visit: Payer: Self-pay

## 2017-12-09 ENCOUNTER — Inpatient Hospital Stay (HOSPITAL_COMMUNITY)
Admission: AD | Admit: 2017-12-09 | Discharge: 2017-12-11 | DRG: 470 | Disposition: A | Discharge status: Skilled Nursing Facility | Payer: PPO | Attending: Orthopedic Surgery | Admitting: Orthopedic Surgery

## 2017-12-09 ENCOUNTER — Encounter (HOSPITAL_COMMUNITY)
Admission: AD | Disposition: A | Discharge status: Skilled Nursing Facility | Payer: Self-pay | Source: Home / Self Care | Attending: Orthopedic Surgery

## 2017-12-09 DIAGNOSIS — M6259 Muscle wasting and atrophy, not elsewhere classified, multiple sites: Secondary | ICD-10-CM | POA: Diagnosis not present

## 2017-12-09 DIAGNOSIS — M1990 Unspecified osteoarthritis, unspecified site: Secondary | ICD-10-CM | POA: Diagnosis not present

## 2017-12-09 DIAGNOSIS — M1711 Unilateral primary osteoarthritis, right knee: Principal | ICD-10-CM | POA: Diagnosis present

## 2017-12-09 DIAGNOSIS — G8918 Other acute postprocedural pain: Secondary | ICD-10-CM | POA: Diagnosis not present

## 2017-12-09 DIAGNOSIS — I10 Essential (primary) hypertension: Secondary | ICD-10-CM | POA: Diagnosis present

## 2017-12-09 DIAGNOSIS — C4492 Squamous cell carcinoma of skin, unspecified: Secondary | ICD-10-CM | POA: Diagnosis not present

## 2017-12-09 DIAGNOSIS — M6289 Other specified disorders of muscle: Secondary | ICD-10-CM | POA: Diagnosis not present

## 2017-12-09 DIAGNOSIS — M810 Age-related osteoporosis without current pathological fracture: Secondary | ICD-10-CM | POA: Diagnosis not present

## 2017-12-09 DIAGNOSIS — Z96651 Presence of right artificial knee joint: Secondary | ICD-10-CM | POA: Diagnosis not present

## 2017-12-09 DIAGNOSIS — T8859XD Other complications of anesthesia, subsequent encounter: Secondary | ICD-10-CM | POA: Diagnosis not present

## 2017-12-09 DIAGNOSIS — E0590 Thyrotoxicosis, unspecified without thyrotoxic crisis or storm: Secondary | ICD-10-CM | POA: Diagnosis not present

## 2017-12-09 DIAGNOSIS — E039 Hypothyroidism, unspecified: Secondary | ICD-10-CM | POA: Diagnosis not present

## 2017-12-09 DIAGNOSIS — R2689 Other abnormalities of gait and mobility: Secondary | ICD-10-CM | POA: Diagnosis not present

## 2017-12-09 DIAGNOSIS — Z471 Aftercare following joint replacement surgery: Secondary | ICD-10-CM | POA: Diagnosis not present

## 2017-12-09 DIAGNOSIS — D649 Anemia, unspecified: Secondary | ICD-10-CM | POA: Diagnosis not present

## 2017-12-09 DIAGNOSIS — Z742 Need for assistance at home and no other household member able to render care: Secondary | ICD-10-CM | POA: Diagnosis not present

## 2017-12-09 DIAGNOSIS — G609 Hereditary and idiopathic neuropathy, unspecified: Secondary | ICD-10-CM | POA: Diagnosis not present

## 2017-12-09 DIAGNOSIS — Z96659 Presence of unspecified artificial knee joint: Secondary | ICD-10-CM

## 2017-12-09 DIAGNOSIS — E785 Hyperlipidemia, unspecified: Secondary | ICD-10-CM | POA: Diagnosis not present

## 2017-12-09 DIAGNOSIS — R112 Nausea with vomiting, unspecified: Secondary | ICD-10-CM | POA: Diagnosis not present

## 2017-12-09 HISTORY — PX: TOTAL KNEE ARTHROPLASTY: SHX125

## 2017-12-09 SURGERY — ARTHROPLASTY, KNEE, TOTAL
Anesthesia: Monitor Anesthesia Care | Site: Knee | Laterality: Right

## 2017-12-09 MED ORDER — ROPIVACAINE HCL 7.5 MG/ML IJ SOLN
INTRAMUSCULAR | Status: DC | PRN
  Administered 2017-12-09: 30 mL via PERINEURAL

## 2017-12-09 MED ORDER — ACETAMINOPHEN 500 MG PO TABS
ORAL_TABLET | ORAL | Status: AC
  Administered 2017-12-09: 1000 mg via ORAL
  Filled 2017-12-09: qty 2

## 2017-12-09 MED ORDER — STERILE WATER FOR IRRIGATION IR SOLN
Status: DC | PRN
  Administered 2017-12-09: 2000 mL

## 2017-12-09 MED ORDER — SODIUM CHLORIDE 0.9 % IR SOLN
Status: DC | PRN
  Administered 2017-12-09: 2000 mL

## 2017-12-09 MED ORDER — ONDANSETRON HCL 4 MG/2ML IJ SOLN: 4.0000 mg | Freq: Four times a day (QID) | INTRAMUSCULAR | Status: DC | PRN

## 2017-12-09 MED ORDER — METHOCARBAMOL 500 MG PO TABS
500.0000 mg | ORAL_TABLET | Freq: Four times a day (QID) | ORAL | Status: DC | PRN
  Administered 2017-12-09 – 2017-12-11 (×3): 500 mg via ORAL
  Filled 2017-12-09 (×3): qty 1

## 2017-12-09 MED ORDER — LEVOTHYROXINE SODIUM 88 MCG PO TABS
88.0000 ug | ORAL_TABLET | Freq: Every day | ORAL | Status: DC
  Administered 2017-12-10 – 2017-12-11 (×2): 88 ug via ORAL
  Filled 2017-12-09 (×2): qty 1

## 2017-12-09 MED ORDER — ASPIRIN EC 325 MG PO TBEC
325.0000 mg | DELAYED_RELEASE_TABLET | Freq: Two times a day (BID) | ORAL | Status: DC
  Administered 2017-12-10 – 2017-12-11 (×3): 325 mg via ORAL
  Filled 2017-12-09 (×3): qty 1

## 2017-12-09 MED ORDER — FENTANYL CITRATE (PF) 100 MCG/2ML IJ SOLN: 25.0000 ug | INTRAMUSCULAR | Status: DC | PRN

## 2017-12-09 MED ORDER — BUPIVACAINE LIPOSOME 1.3 % IJ SUSP
INTRAMUSCULAR | Status: DC | PRN
  Administered 2017-12-09: 20 mL

## 2017-12-09 MED ORDER — ZOLPIDEM TARTRATE 5 MG PO TABS: 5.0000 mg | ORAL_TABLET | Freq: Every evening | ORAL | Status: DC | PRN

## 2017-12-09 MED ORDER — DEXAMETHASONE SODIUM PHOSPHATE 10 MG/ML IJ SOLN
10.0000 mg | Freq: Once | INTRAMUSCULAR | Status: AC
  Administered 2017-12-10: 10 mg via INTRAVENOUS
  Filled 2017-12-09: qty 1

## 2017-12-09 MED ORDER — METOCLOPRAMIDE HCL 5 MG/ML IJ SOLN: 5.0000 mg | Freq: Three times a day (TID) | INTRAMUSCULAR | Status: DC | PRN

## 2017-12-09 MED ORDER — FENTANYL CITRATE (PF) 100 MCG/2ML IJ SOLN
INTRAMUSCULAR | Status: AC
  Administered 2017-12-09: 100 ug via INTRAVENOUS
  Filled 2017-12-09: qty 2

## 2017-12-09 MED ORDER — LACTATED RINGERS IV SOLN
INTRAVENOUS | Status: DC
  Administered 2017-12-09 (×2): via INTRAVENOUS

## 2017-12-09 MED ORDER — MENTHOL 3 MG MT LOZG: 1.0000 | LOZENGE | OROMUCOSAL | Status: DC | PRN

## 2017-12-09 MED ORDER — PROPOFOL 10 MG/ML IV BOLUS
INTRAVENOUS | Status: AC
  Filled 2017-12-09: qty 40

## 2017-12-09 MED ORDER — OXYCODONE HCL 5 MG PO TABS
5.0000 mg | ORAL_TABLET | ORAL | Status: DC | PRN
  Administered 2017-12-09 – 2017-12-10 (×3): 5 mg via ORAL
  Administered 2017-12-10 – 2017-12-11 (×2): 10 mg via ORAL
  Filled 2017-12-09: qty 2
  Filled 2017-12-09 (×2): qty 1
  Filled 2017-12-09: qty 2
  Filled 2017-12-09: qty 1

## 2017-12-09 MED ORDER — TRANEXAMIC ACID 1000 MG/10ML IV SOLN
INTRAVENOUS | Status: AC
  Filled 2017-12-09: qty 10

## 2017-12-09 MED ORDER — BISACODYL 5 MG PO TBEC: 5.0000 mg | DELAYED_RELEASE_TABLET | Freq: Every day | ORAL | Status: DC | PRN

## 2017-12-09 MED ORDER — ONDANSETRON HCL 4 MG/2ML IJ SOLN
INTRAMUSCULAR | Status: AC
  Filled 2017-12-09: qty 2

## 2017-12-09 MED ORDER — PROPOFOL 10 MG/ML IV BOLUS
INTRAVENOUS | Status: DC | PRN
  Administered 2017-12-09: 20 mg via INTRAVENOUS

## 2017-12-09 MED ORDER — ONDANSETRON HCL 4 MG PO TABS: 4.0000 mg | ORAL_TABLET | Freq: Four times a day (QID) | ORAL | Status: DC | PRN

## 2017-12-09 MED ORDER — BUPIVACAINE-EPINEPHRINE (PF) 0.25% -1:200000 IJ SOLN
INTRAMUSCULAR | Status: AC
  Filled 2017-12-09: qty 30

## 2017-12-09 MED ORDER — GABAPENTIN 300 MG PO CAPS: 300.0000 mg | ORAL_CAPSULE | Freq: Once | ORAL | Status: DC

## 2017-12-09 MED ORDER — GABAPENTIN 300 MG PO CAPS
ORAL_CAPSULE | ORAL | Status: AC
  Filled 2017-12-09: qty 1

## 2017-12-09 MED ORDER — MEPERIDINE HCL 50 MG/ML IJ SOLN: 6.2500 mg | INTRAMUSCULAR | Status: DC | PRN

## 2017-12-09 MED ORDER — SODIUM CHLORIDE 0.9 % IJ SOLN
INTRAMUSCULAR | Status: AC
  Filled 2017-12-09: qty 50

## 2017-12-09 MED ORDER — TRANEXAMIC ACID 1000 MG/10ML IV SOLN
1000.0000 mg | INTRAVENOUS | Status: AC
  Administered 2017-12-09: 1000 mg via INTRAVENOUS

## 2017-12-09 MED ORDER — FERROUS SULFATE 325 (65 FE) MG PO TABS
325.0000 mg | ORAL_TABLET | Freq: Three times a day (TID) | ORAL | Status: DC
  Administered 2017-12-09 – 2017-12-11 (×5): 325 mg via ORAL
  Filled 2017-12-09 (×5): qty 1

## 2017-12-09 MED ORDER — CHLORHEXIDINE GLUCONATE 4 % EX LIQD: 60.0000 mL | Freq: Once | CUTANEOUS | Status: DC

## 2017-12-09 MED ORDER — TRIAMTERENE-HCTZ 37.5-25 MG PO CAPS
1.0000 | ORAL_CAPSULE | Freq: Every day | ORAL | Status: DC
  Administered 2017-12-11: 1 via ORAL
  Filled 2017-12-09 (×5): qty 1

## 2017-12-09 MED ORDER — CEFAZOLIN SODIUM-DEXTROSE 2-4 GM/100ML-% IV SOLN
INTRAVENOUS | Status: AC
  Filled 2017-12-09: qty 100

## 2017-12-09 MED ORDER — PHENOL 1.4 % MT LIQD: 1.0000 | OROMUCOSAL | Status: DC | PRN

## 2017-12-09 MED ORDER — PROPOFOL 500 MG/50ML IV EMUL
INTRAVENOUS | Status: DC | PRN
  Administered 2017-12-09: 75 ug/kg/min via INTRAVENOUS

## 2017-12-09 MED ORDER — METHOCARBAMOL 500 MG IVPB - SIMPLE MED
500.0000 mg | Freq: Four times a day (QID) | INTRAVENOUS | Status: DC | PRN
  Filled 2017-12-09: qty 50

## 2017-12-09 MED ORDER — GABAPENTIN 300 MG PO CAPS
300.0000 mg | ORAL_CAPSULE | Freq: Three times a day (TID) | ORAL | Status: DC
  Administered 2017-12-09 – 2017-12-11 (×7): 300 mg via ORAL
  Filled 2017-12-09 (×7): qty 1

## 2017-12-09 MED ORDER — ALUM & MAG HYDROXIDE-SIMETH 200-200-20 MG/5ML PO SUSP: 30.0000 mL | ORAL | Status: DC | PRN

## 2017-12-09 MED ORDER — MIDAZOLAM HCL 2 MG/2ML IJ SOLN
INTRAMUSCULAR | Status: AC
  Filled 2017-12-09: qty 2

## 2017-12-09 MED ORDER — ACETAMINOPHEN 500 MG PO TABS
1000.0000 mg | ORAL_TABLET | Freq: Once | ORAL | Status: AC
  Administered 2017-12-09: 1000 mg via ORAL

## 2017-12-09 MED ORDER — SODIUM CHLORIDE 0.9 % IR SOLN
Status: DC | PRN
  Administered 2017-12-09: 1000 mL

## 2017-12-09 MED ORDER — CEFAZOLIN SODIUM-DEXTROSE 2-4 GM/100ML-% IV SOLN
2.0000 g | Freq: Four times a day (QID) | INTRAVENOUS | Status: AC
  Administered 2017-12-09 (×2): 2 g via INTRAVENOUS
  Filled 2017-12-09 (×2): qty 100

## 2017-12-09 MED ORDER — ONDANSETRON HCL 4 MG/2ML IJ SOLN
INTRAMUSCULAR | Status: DC | PRN
  Administered 2017-12-09: 4 mg via INTRAVENOUS

## 2017-12-09 MED ORDER — PANTOPRAZOLE SODIUM 40 MG PO TBEC
40.0000 mg | DELAYED_RELEASE_TABLET | Freq: Every day | ORAL | Status: DC
  Administered 2017-12-09 – 2017-12-11 (×3): 40 mg via ORAL
  Filled 2017-12-09 (×3): qty 1

## 2017-12-09 MED ORDER — TRAMADOL HCL 50 MG PO TABS
50.0000 mg | ORAL_TABLET | Freq: Four times a day (QID) | ORAL | Status: DC
  Administered 2017-12-09 – 2017-12-11 (×4): 50 mg via ORAL
  Filled 2017-12-09 (×5): qty 1

## 2017-12-09 MED ORDER — ACETAMINOPHEN 500 MG PO TABS
1000.0000 mg | ORAL_TABLET | Freq: Four times a day (QID) | ORAL | Status: AC
  Administered 2017-12-09 – 2017-12-10 (×4): 1000 mg via ORAL
  Filled 2017-12-09 (×5): qty 2

## 2017-12-09 MED ORDER — DIPHENHYDRAMINE HCL 12.5 MG/5ML PO ELIX: 12.5000 mg | ORAL_SOLUTION | ORAL | Status: DC | PRN

## 2017-12-09 MED ORDER — BUPIVACAINE IN DEXTROSE 0.75-8.25 % IT SOLN
INTRATHECAL | Status: DC | PRN
  Administered 2017-12-09: 1.8 mL via INTRATHECAL

## 2017-12-09 MED ORDER — SODIUM CHLORIDE 0.9 % IV SOLN
INTRAVENOUS | Status: DC
  Administered 2017-12-09 – 2017-12-10 (×2): via INTRAVENOUS

## 2017-12-09 MED ORDER — DEXAMETHASONE SODIUM PHOSPHATE 10 MG/ML IJ SOLN
INTRAMUSCULAR | Status: AC
  Filled 2017-12-09: qty 1

## 2017-12-09 MED ORDER — TRANEXAMIC ACID 1000 MG/10ML IV SOLN
1000.0000 mg | Freq: Once | INTRAVENOUS | Status: AC
  Administered 2017-12-09: 1000 mg via INTRAVENOUS
  Filled 2017-12-09: qty 1000

## 2017-12-09 MED ORDER — METOCLOPRAMIDE HCL 5 MG PO TABS: 5.0000 mg | ORAL_TABLET | Freq: Three times a day (TID) | ORAL | Status: DC | PRN

## 2017-12-09 MED ORDER — LIDOCAINE 2% (20 MG/ML) 5 ML SYRINGE
INTRAMUSCULAR | Status: DC | PRN
  Administered 2017-12-09: 50 mg via INTRAVENOUS

## 2017-12-09 MED ORDER — MIDAZOLAM HCL 2 MG/2ML IJ SOLN: 1.0000 mg | INTRAMUSCULAR | Status: DC

## 2017-12-09 MED ORDER — CEFAZOLIN SODIUM-DEXTROSE 2-4 GM/100ML-% IV SOLN
2.0000 g | INTRAVENOUS | Status: AC
  Administered 2017-12-09: 2 g via INTRAVENOUS

## 2017-12-09 MED ORDER — HYDROMORPHONE HCL 1 MG/ML IJ SOLN: 0.5000 mg | INTRAMUSCULAR | Status: DC | PRN

## 2017-12-09 MED ORDER — DEXAMETHASONE SODIUM PHOSPHATE 10 MG/ML IJ SOLN
8.0000 mg | Freq: Once | INTRAMUSCULAR | Status: AC
  Administered 2017-12-09: 10 mg via INTRAVENOUS

## 2017-12-09 MED ORDER — EPHEDRINE SULFATE-NACL 50-0.9 MG/10ML-% IV SOSY
PREFILLED_SYRINGE | INTRAVENOUS | Status: DC | PRN
  Administered 2017-12-09 (×3): 10 mg via INTRAVENOUS
  Administered 2017-12-09: 5 mg via INTRAVENOUS

## 2017-12-09 MED ORDER — FENTANYL CITRATE (PF) 100 MCG/2ML IJ SOLN
50.0000 ug | INTRAMUSCULAR | Status: DC
  Administered 2017-12-09: 100 ug via INTRAVENOUS

## 2017-12-09 MED ORDER — DOCUSATE SODIUM 100 MG PO CAPS
100.0000 mg | ORAL_CAPSULE | Freq: Two times a day (BID) | ORAL | Status: DC
  Administered 2017-12-09 – 2017-12-11 (×5): 100 mg via ORAL
  Filled 2017-12-09 (×5): qty 1

## 2017-12-09 MED ORDER — SENNOSIDES-DOCUSATE SODIUM 8.6-50 MG PO TABS
1.0000 | ORAL_TABLET | Freq: Every evening | ORAL | Status: DC | PRN
  Administered 2017-12-10: 1 via ORAL
  Filled 2017-12-09: qty 1

## 2017-12-09 MED ORDER — FLEET ENEMA 7-19 GM/118ML RE ENEM: 1.0000 | ENEMA | Freq: Once | RECTAL | Status: DC | PRN

## 2017-12-09 MED ORDER — BUPIVACAINE-EPINEPHRINE (PF) 0.25% -1:200000 IJ SOLN
INTRAMUSCULAR | Status: DC | PRN
  Administered 2017-12-09: 30 mL

## 2017-12-09 MED ORDER — SODIUM CHLORIDE 0.9% FLUSH
INTRAVENOUS | Status: DC | PRN
  Administered 2017-12-09: 20 mL

## 2017-12-09 SURGICAL SUPPLY — 61 items
ARTISURF 12M PLY R 6-9CD KNEE (Knees) ×3 IMPLANT
BAG ZIPLOCK 12X15 (MISCELLANEOUS) ×3 IMPLANT
BANDAGE ACE 6X5 VEL STRL LF (GAUZE/BANDAGES/DRESSINGS) ×3 IMPLANT
BLADE SAGITTAL 13X1.27X60 (BLADE) ×2 IMPLANT
BLADE SAGITTAL 13X1.27X60MM (BLADE) ×1
BLADE SAW SGTL 83.5X18.5 (BLADE) ×3 IMPLANT
BLADE SURG 15 STRL LF DISP TIS (BLADE) ×1 IMPLANT
BLADE SURG 15 STRL SS (BLADE) ×2
BOWL SMART MIX CTS (DISPOSABLE) ×3 IMPLANT
CEMENT BONE DEPUY (Cement) IMPLANT
CEMENT BONE SIMPLEX SPEEDSET (Cement) ×6 IMPLANT
CLOSURE WOUND 1/2 X4 (GAUZE/BANDAGES/DRESSINGS) ×2
COVER SURGICAL LIGHT HANDLE (MISCELLANEOUS) ×3 IMPLANT
CUFF TOURN SGL QUICK 34 (TOURNIQUET CUFF) ×2
CUFF TRNQT CYL 34X4X40X1 (TOURNIQUET CUFF) ×1 IMPLANT
DECANTER SPIKE VIAL GLASS SM (MISCELLANEOUS) ×6 IMPLANT
DRAPE INCISE IOBAN 66X45 STRL (DRAPES) ×6 IMPLANT
DRAPE U-SHAPE 47X51 STRL (DRAPES) ×3 IMPLANT
DRESSING AQUACEL AG SP 3.5X10 (GAUZE/BANDAGES/DRESSINGS) ×1 IMPLANT
DRSG AQUACEL AG ADV 3.5X10 (GAUZE/BANDAGES/DRESSINGS) ×3 IMPLANT
DRSG AQUACEL AG SP 3.5X10 (GAUZE/BANDAGES/DRESSINGS) ×3
DURAPREP 26ML APPLICATOR (WOUND CARE) ×6 IMPLANT
ELECT REM PT RETURN 15FT ADLT (MISCELLANEOUS) ×3 IMPLANT
FEMUR  CMT CCR STD SZ7 R KNEE (Knees) ×2 IMPLANT
FEMUR CMT CCR STD SZ7 R KNEE (Knees) ×1 IMPLANT
FEMUR CMTD CCR STD SZ7 R KNEE (Knees) ×1 IMPLANT
GLOVE BIOGEL M STRL SZ7.5 (GLOVE) ×6 IMPLANT
GLOVE BIOGEL PI IND STRL 6.5 (GLOVE) ×1 IMPLANT
GLOVE BIOGEL PI IND STRL 7.5 (GLOVE) ×3 IMPLANT
GLOVE BIOGEL PI IND STRL 8.5 (GLOVE) ×2 IMPLANT
GLOVE BIOGEL PI INDICATOR 6.5 (GLOVE) ×2
GLOVE BIOGEL PI INDICATOR 7.5 (GLOVE) ×6
GLOVE BIOGEL PI INDICATOR 8.5 (GLOVE) ×4
GLOVE SURG ORTHO 8.0 STRL STRW (GLOVE) ×6 IMPLANT
GLOVE SURG SS PI 7.0 STRL IVOR (GLOVE) ×3 IMPLANT
GOWN STRL REUS W/ TWL XL LVL3 (GOWN DISPOSABLE) ×3 IMPLANT
GOWN STRL REUS W/TWL XL LVL3 (GOWN DISPOSABLE) ×9 IMPLANT
HANDPIECE INTERPULSE COAX TIP (DISPOSABLE) ×2
HOLDER FOLEY CATH W/STRAP (MISCELLANEOUS) ×3 IMPLANT
HOOD PEEL AWAY FLYTE STAYCOOL (MISCELLANEOUS) ×9 IMPLANT
MANIFOLD NEPTUNE II (INSTRUMENTS) ×3 IMPLANT
PACK TOTAL KNEE CUSTOM (KITS) ×3 IMPLANT
POSITIONER SURGICAL ARM (MISCELLANEOUS) ×3 IMPLANT
SET HNDPC FAN SPRY TIP SCT (DISPOSABLE) ×1 IMPLANT
SPONGE LAP 18X18 RF (DISPOSABLE) IMPLANT
STEM POLY PAT PLY 32M KNEE (Knees) ×3 IMPLANT
STEM TIBIA 5 DEG SZ D R KNEE (Knees) ×1 IMPLANT
STRIP CLOSURE SKIN 1/2X4 (GAUZE/BANDAGES/DRESSINGS) ×4 IMPLANT
SUT BONE WAX W31G (SUTURE) ×3 IMPLANT
SUT MNCRL AB 4-0 PS2 18 (SUTURE) ×3 IMPLANT
SUT STRATAFIX PDS+ 0 24IN (SUTURE) ×3 IMPLANT
SUT VIC AB 0 CT1 36 (SUTURE) ×3 IMPLANT
SUT VIC AB 1 CT1 27 (SUTURE) ×2
SUT VIC AB 1 CT1 27XBRD ANTBC (SUTURE) ×1 IMPLANT
SUT VIC AB 2-0 CT1 27 (SUTURE) ×4
SUT VIC AB 2-0 CT1 TAPERPNT 27 (SUTURE) ×2 IMPLANT
SYR CONTROL 10ML LL (SYRINGE) ×6 IMPLANT
TIBIA STEM 5 DEG SZ D R KNEE (Knees) ×3 IMPLANT
TRAY FOLEY MTR SLVR 14FR STAT (SET/KITS/TRAYS/PACK) ×3 IMPLANT
WRAP KNEE MAXI GEL POST OP (GAUZE/BANDAGES/DRESSINGS) ×3 IMPLANT
YANKAUER SUCT BULB TIP 10FT TU (MISCELLANEOUS) ×3 IMPLANT

## 2017-12-09 NOTE — Progress Notes (Signed)
AssistedDr. Oddono with right, ultrasound guided, adductor canal block. Side rails up, monitors on throughout procedure. See vital signs in flow sheet. Tolerated Procedure well.  

## 2017-12-09 NOTE — Anesthesia Procedure Notes (Signed)
Anesthesia Regional Block: Adductor canal block   Pre-Anesthetic Checklist: ,, timeout performed, Correct Patient, Correct Site, Correct Laterality, Correct Procedure, Correct Position, site marked, Risks and benefits discussed,  Surgical consent,  Pre-op evaluation,  At surgeon's request and post-op pain management  Laterality: Right  Prep: chloraprep       Needles:  Injection technique: Single-shot  Needle Type: Echogenic Stimulator Needle     Needle Length: 5cm  Needle Gauge: 22     Additional Needles:   Procedures:, nerve stimulator,,, ultrasound used (permanent image in chart),,,,  Narrative:  Start time: 12/09/2017 7:47 AM End time: 12/09/2017 7:52 AM Injection made incrementally with aspirations every 5 mL.  Performed by: Personally  Anesthesiologist: Janeece Riggers, MD  Additional Notes: Functioning IV was confirmed and monitors were applied.  A 23mm 22ga Arrow echogenic stimulator needle was used. Sterile prep and drape,hand hygiene and sterile gloves were used. Ultrasound guidance: relevant anatomy identified, needle position confirmed, local anesthetic spread visualized around nerve(s)., vascular puncture avoided.  Image printed for medical record. Negative aspiration and negative test dose prior to incremental administration of local anesthetic. The patient tolerated the procedure well.

## 2017-12-09 NOTE — Anesthesia Procedure Notes (Signed)
Spinal  Patient location during procedure: OR End time: 12/09/2017 8:27 AM Staffing Resident/CRNA: Noralyn Pick D, CRNA Performed: anesthesiologist  Preanesthetic Checklist Completed: patient identified, site marked, surgical consent, pre-op evaluation, timeout performed, IV checked, risks and benefits discussed and monitors and equipment checked Spinal Block Patient position: sitting Prep: Betadine Patient monitoring: heart rate, continuous pulse ox and blood pressure Approach: right paramedian Location: L3-4 Injection technique: single-shot Needle Needle type: Sprotte and Spinocan  Needle gauge: 24 G Needle length: 9 cm Assessment Sensory level: T6 Additional Notes Expiration date of kit checked and confirmed. Patient tolerated procedure well, without complications.

## 2017-12-09 NOTE — Anesthesia Postprocedure Evaluation (Signed)
Anesthesia Post Note  Patient: Shakeya Kerkman Larch  Procedure(s) Performed: RIGHT TOTAL KNEE ARTHROPLASTY (Right Knee)     Patient location during evaluation: PACU Anesthesia Type: MAC Level of consciousness: oriented and awake and alert Pain management: pain level controlled Vital Signs Assessment: post-procedure vital signs reviewed and stable Respiratory status: spontaneous breathing, respiratory function stable and patient connected to nasal cannula oxygen Cardiovascular status: blood pressure returned to baseline and stable Postop Assessment: no headache, no backache and no apparent nausea or vomiting Anesthetic complications: no    Last Vitals:  Vitals:   12/09/17 1115 12/09/17 1130  BP:    Pulse:  71  Resp:  16  Temp: 36.8 C   SpO2:  100%    Last Pain:  Vitals:   12/09/17 1115  TempSrc:   PainSc: 0-No pain                 Kaliah Haddaway

## 2017-12-09 NOTE — Transfer of Care (Signed)
Immediate Anesthesia Transfer of Care Note  Patient: Patricia Mason  Procedure(s) Performed: RIGHT TOTAL KNEE ARTHROPLASTY (Right Knee)  Patient Location: PACU  Anesthesia Type:Regional and Spinal  Level of Consciousness: awake, alert  and oriented  Airway & Oxygen Therapy: Patient Spontanous Breathing and Patient connected to face mask oxygen  Post-op Assessment: Report given to RN and Post -op Vital signs reviewed and stable  Post vital signs: Reviewed and stable  Last Vitals:  Vitals Value Taken Time  BP    Temp    Pulse 75 12/09/2017 10:24 AM  Resp 17 12/09/2017 10:24 AM  SpO2 93 % 12/09/2017 10:24 AM  Vitals shown include unvalidated device data.  Last Pain:  Vitals:   12/09/17 0629  TempSrc: Oral         Complications: No apparent anesthesia complications

## 2017-12-09 NOTE — Evaluation (Signed)
Physical Therapy Evaluation Patient Details Name: Patricia Mason MRN: [id number] DOB: 1930-12-16 Today's Date: 12/09/2017   History of Present Illness  Pt is a 84 YO female s/p R TKR on 9/23. PMH includes skin cancer, HTN, hyperthyroid, peripheral neuropathy. Surgical history includes cataracts extraction, R meniscus repair.   Clinical Impression   Pt presents with R knee pain, decreased activity tolerance, gait deviations due to TKR, and mild balance deficits. PT to address deficits acutely. Pt ambulated short distance in hallway with min guard assist for safety. PT to continue to progress mobility as tolerated. Pt plans to d/c to [facility] in [place]. Will continue to follow acutely.     Follow Up Recommendations Follow surgeon's recommendation for DC plan and follow-up therapies;Supervision for mobility/OOB(SNF)    Equipment Recommendations  None recommended by PT    Recommendations for Other Services       Precautions / Restrictions Precautions Precautions: Knee;Fall Restrictions Weight Bearing Restrictions: No Other Position/Activity Restrictions: WBAT       Mobility  Bed Mobility Overal bed mobility: Needs Assistance Bed Mobility: Supine to Sit     Supine to sit: Min assist;HOB elevated     General bed mobility comments: Min assist for RLE management. Verbal cuing for using LLE to assist with scooting to EOB. Increased time and effort to get to EOB.   Transfers Overall transfer level: Needs assistance Equipment used: Rolling walker (2 wheeled) Transfers: Sit to/from Stand Sit to Stand: Min guard         General transfer comment: Min guard for safety. Able to shift weight R and L without R knee buckling.   Ambulation/Gait Ambulation/Gait assistance: Min guard Gait Distance (Feet): 40 Feet Assistive device: Rolling walker (2 wheeled) Gait Pattern/deviations: Step-to pattern;Step-through pattern;Decreased weight shift to right;Decreased stance time -  right;Antalgic;Trunk flexed Gait velocity: decr    General Gait Details: Min guard for safety. Pt attempting step-through gait pattern, PT advised step-to pattern for safety. Verbal cuing for sequencing, placement in RW.   Stairs            Wheelchair Mobility    Modified Rankin (Stroke Patients Only)       Balance Overall balance assessment: Mild deficits observed, not formally tested                                           Pertinent Vitals/Pain Pain Assessment: 0-10 Pain Score: 3  Pain Location: R knee  Pain Descriptors / Indicators: Aching;Sore Pain Intervention(s): Limited activity within patient's tolerance;Repositioned;Monitored during session;Ice applied    Home Living Family/patient expects to be discharged to:: Skilled nursing facility Living Arrangements: Alone                    Prior Function Level of Independence: Independent         Comments: DME delivering RW and BSC to room, per pt      Hand Dominance   Dominant Hand: Right    Extremity/Trunk Assessment   Upper Extremity Assessment Upper Extremity Assessment: Overall WFL for tasks assessed    Lower Extremity Assessment Lower Extremity Assessment: Overall WFL for tasks assessed;RLE deficits/detail;LLE deficits/detail RLE Deficits / Details: Suspected post surgical weakness; able to peform quad set x5 RLE Sensation: history of peripheral neuropathy LLE Sensation: history of peripheral neuropathy    Cervical / Trunk Assessment Cervical / Trunk Assessment: Normal  Communication  Communication: No difficulties  Cognition Arousal/Alertness: Awake/alert Behavior During Therapy: WFL for tasks assessed/performed Overall Cognitive Status: Within Functional Limits for tasks assessed                                        General Comments      Exercises Total Joint Exercises Ankle Circles/Pumps: AROM;Both;10 reps;Supine Quad Sets:  AROM;Right;5 reps;Supine Heel Slides: AAROM;Right;Seated(3 reps ) Goniometric ROM: Knee AAROM approximately 5-100*   Assessment/Plan    PT Assessment Patient needs continued PT services  PT Problem List Decreased strength;Pain;Decreased range of motion;Decreased activity tolerance;Decreased knowledge of use of DME;Decreased balance;Decreased mobility;Decreased safety awareness       PT Treatment Interventions DME instruction;Therapeutic activities;Gait training;Therapeutic exercise;Patient/family education;Balance training;Functional mobility training    PT Goals (Current goals can be found in the Care Plan section)  Acute Rehab PT Goals PT Goal Formulation: With patient Time For Goal Achievement: 12/23/17 Potential to Achieve Goals: Good    Frequency 7X/week   Barriers to discharge        Co-evaluation               AM-PAC PT "6 Clicks" Daily Activity  Outcome Measure Difficulty turning over in bed (including adjusting bedclothes, sheets and blankets)?: Unable Difficulty moving from lying on back to sitting on the side of the bed? : Unable Difficulty sitting down on and standing up from a chair with arms (e.g., wheelchair, bedside commode, etc,.)?: Unable Help needed moving to and from a bed to chair (including a wheelchair)?: A Little Help needed walking in hospital room?: A Little Help needed climbing 3-5 steps with a railing? : A Little 6 Click Score: 12    End of Session Equipment Utilized During Treatment: Gait belt Activity Tolerance: Patient tolerated treatment well;No increased pain Patient left: in chair;with chair alarm set;with call bell/phone within reach;with family/visitor present;with SCD's reapplied Nurse Communication: Mobility status PT Visit Diagnosis: Other abnormalities of gait and mobility (R26.89);Difficulty in walking, not elsewhere classified (R26.2)    Time: 8638-1771 PT Time Calculation (min) (ACUTE ONLY): 24 min   Charges:   PT  Evaluation $PT Eval Low Complexity: 1 Low PT Treatments $Gait Training: 8-22 mins        Julien Girt, PT Acute Rehabilitation Services Pager [phone]  Office [phone]  Patricia Mason 12/09/2017, 7:08 PM

## 2017-12-09 NOTE — H&P (Signed)
Patricia Mason MRN:  [id number] DOB/SEX:  1930-04-03/female  CHIEF COMPLAINT:  Painful right Knee  HISTORY: Patient is a 84 y.o. female presented with a history of pain in the right knee. Onset of symptoms was gradual starting a few years ago with gradually worsening course since that time. Patient has been treated conservatively with over-the-counter NSAIDs and activity modification. Patient currently rates pain in the knee at 10 out of 10 with activity. There is pain at night.  PAST MEDICAL HISTORY: There are no active problems to display for this patient.  Past Medical History:  Diagnosis Date  . Cancer (HCC)    Skin-Squamous cell  . Complication of anesthesia   . Hypertension   . Hyperthyroidism   . PONV (postoperative nausea and vomiting)    Past Surgical History:  Procedure Laterality Date  . EYE SURGERY Bilateral 03/2016   Cataract  . MENISCUS REPAIR Right   . SQUAMOUS CELL CARCINOMA EXCISION Right 2018   Hand  . TONSILLECTOMY       MEDICATIONS:   Medications Prior to Admission  Medication Sig Dispense Refill Last Dose  . Biotin 5000 MCG TABS Take 1 tablet by mouth daily.     . calcium-vitamin D (OSCAL WITH D) 500-200 MG-UNIT tablet Take 1 tablet by mouth daily.     . ferrous sulfate 325 (65 FE) MG tablet Take 325 mg by mouth daily with breakfast.     . gabapentin (NEURONTIN) 300 MG capsule Take 300 mg by mouth 3 (three) times daily.     Marland Kitchen levothyroxine (SYNTHROID, LEVOTHROID) 88 MCG tablet Take 88 mcg by mouth daily before breakfast.     . Omega-3 Fatty Acids (FISH OIL) 1000 MG CAPS Take 1 capsule by mouth daily.     Marland Kitchen triamterene-hydrochlorothiazide (DYAZIDE) 37.5-25 MG capsule Take 1 capsule by mouth daily.     Marland Kitchen trolamine salicylate (ASPERCREME) 10 % cream Apply 1 application topically 2 (two) times daily as needed for muscle pain.     . vitamin B-12 (CYANOCOBALAMIN) 1000 MCG tablet Take 1,000 mcg by mouth daily.       ALLERGIES:  No Known Allergies  REVIEW OF  SYSTEMS:  A comprehensive review of systems was negative except for: Musculoskeletal: positive for arthralgias and bone pain   FAMILY HISTORY:  No family history on file.  SOCIAL HISTORY:   Social History   Tobacco Use  . Smoking status: Never Smoker  . Smokeless tobacco: Never Used  Substance Use Topics  . Alcohol use: Never    Frequency: Never     EXAMINATION:  Vital signs in last 24 hours:    LMP 12/04/2017   General Appearance:    Alert, cooperative, no distress, appears stated age  Head:    Normocephalic, without obvious abnormality, atraumatic  Eyes:    PERRL, conjunctiva/corneas clear, EOM's intact, fundi    benign, both eyes  Ears:    Normal TM's and external ear canals, both ears  Nose:   Nares normal, septum midline, mucosa normal, no drainage    or sinus tenderness  Throat:   Lips, mucosa, and tongue normal; teeth and gums normal  Neck:   Supple, symmetrical, trachea midline, no adenopathy;    thyroid:  no enlargement/tenderness/nodules; no carotid   bruit or JVD  Back:     Symmetric, no curvature, ROM normal, no CVA tenderness  Lungs:     Clear to auscultation bilaterally, respirations unlabored  Chest Wall:    No tenderness or deformity  Heart:    Regular rate and rhythm, S1 and S2 normal, no murmur, rub   or gallop  Breast Exam:    No tenderness, masses, or nipple abnormality  Abdomen:     Soft, non-tender, bowel sounds active all four quadrants,    no masses, no organomegaly  Genitalia:    Normal female without lesion, discharge or tenderness  Rectal:    Normal tone, no masses or tenderness;   guaiac negative stool  Extremities:   Extremities normal, atraumatic, no cyanosis or edema  Pulses:   2+ and symmetric all extremities  Skin:   Skin color, texture, turgor normal, no rashes or lesions  Lymph nodes:   Cervical, supraclavicular, and axillary nodes normal  Neurologic:   CNII-XII intact, normal strength, sensation and reflexes    throughout     Musculoskeletal:  ROM 0-120, Ligaments intact,  Imaging Review Plain radiographs demonstrate severe degenerative joint disease of the left knee. The overall alignment is neutral. The bone quality appears to be good for age and reported activity level.  Assessment/Plan: Primary osteoarthritis, left knee   The patient history, physical examination and imaging studies are consistent with advanced degenerative joint disease of the left knee. The patient has failed conservative treatment.  The clearance notes were reviewed.  After discussion with the patient it was felt that Total Knee Replacement was indicated. The procedure,  risks, and benefits of total knee arthroplasty were presented and reviewed. The risks including but not limited to aseptic loosening, infection, blood clots, vascular injury, stiffness, patella tracking problems complications among others were discussed. The patient acknowledged the explanation, agreed to proceed with the plan.  Preoperative templating of the joint replacement has been completed, documented, and submitted to the Operating Room personnel in order to optimize intra-operative equipment management.   Anticipated LOS equal to or greater than 2 midnights due to - Age 4 and older with one or more of the following:  - Obesity  - Expected need for hospital services (PT, OT, Nursing) required for safe  discharge  - Anticipated need for postoperative skilled nursing care or inpatient rehab      Donia Ast 12/09/2017, 6:18 AM

## 2017-12-10 ENCOUNTER — Encounter (HOSPITAL_COMMUNITY): Payer: Self-pay | Admitting: Orthopedic Surgery

## 2017-12-10 LAB — BASIC METABOLIC PANEL
Anion gap: 7 (ref 5–15)
BUN: 16 mg/dL (ref 8–23)
CHLORIDE: 106 mmol/L (ref 98–111)
CO2: 30 mmol/L (ref 22–32)
CREATININE: 0.88 mg/dL (ref 0.44–1.00)
Calcium: 7.9 mg/dL — ABNORMAL LOW (ref 8.9–10.3)
GFR calc non Af Amer: 59 mL/min — ABNORMAL LOW (ref >60)
Glucose, Bld: 126 mg/dL — ABNORMAL HIGH (ref 70–99)
POTASSIUM: 3.9 mmol/L (ref 3.5–5.1)
Sodium: 143 mmol/L (ref 135–145)

## 2017-12-10 LAB — CBC
HEMATOCRIT: 32.2 % — AB (ref 36.0–46.0)
HEMOGLOBIN: 10.5 g/dL — AB (ref 12.0–15.0)
MCH: 31.8 pg (ref 26.0–34.0)
MCHC: 32.6 g/dL (ref 30.0–36.0)
MCV: 97.6 fL (ref 78.0–100.0)
Platelets: 205 10*3/uL (ref 150–400)
RBC: 3.3 MIL/uL — AB (ref 3.87–5.11)
RDW: 12.8 % (ref 11.5–15.5)
WBC: 8.4 10*3/uL (ref 4.0–10.5)

## 2017-12-10 MED ORDER — METHOCARBAMOL 500 MG PO TABS: 500.0000 mg | ORAL_TABLET | Freq: Four times a day (QID) | ORAL | 0 refills | Status: DC | PRN

## 2017-12-10 MED ORDER — OXYCODONE HCL 5 MG PO TABS: 5.0000 mg | ORAL_TABLET | Freq: Four times a day (QID) | ORAL | 0 refills | Status: DC | PRN

## 2017-12-10 MED ORDER — ASPIRIN 325 MG PO TBEC: 325.0000 mg | DELAYED_RELEASE_TABLET | Freq: Two times a day (BID) | ORAL | 0 refills | Status: DC

## 2017-12-10 NOTE — NC FL2 (Signed)
[place] LEVEL OF CARE SCREENING TOOL     IDENTIFICATION  Patient Name: Patricia Mason Birthdate: 03/18/1931 Sex: female Admission Date (Current Location): 12/09/2017  [facility] and [place] Number:  Herbalist and Address:  [facility],  [place] [address] [place]      Provider Number: [id number]  Attending Physician Name and Address:  Vickey Huger, MD  Relative Name and Phone Number:       Current Level of Care: Hospital Recommended Level of Care: [place] Prior Approval Number:    Date Approved/Denied:   PASRR Number:    Discharge Plan: SNF    Current Diagnoses: Patient Active Problem List   Diagnosis Date Noted  . S/P total knee replacement 12/09/2017    Orientation RESPIRATION BLADDER Height & Weight     Self, Time, Situation, Place  Normal Continent Weight: 151 lb 8 oz (68.7 kg) Height:  5\' 6"  (167.6 cm)  BEHAVIORAL SYMPTOMS/MOOD NEUROLOGICAL BOWEL NUTRITION STATUS      Continent Diet(Regular )  AMBULATORY STATUS COMMUNICATION OF NEEDS Skin   Extensive Assist Verbally Surgical wounds                       Personal Care Assistance Level of Assistance  Bathing, Dressing, Feeding Bathing Assistance: Limited assistance Feeding assistance: Independent Dressing Assistance: Limited assistance     Functional Limitations Info  Sight, Hearing, Speech Sight Info: Adequate Hearing Info: Adequate Speech Info: Adequate    SPECIAL CARE FACTORS FREQUENCY  PT (By licensed PT), OT (By licensed OT)     PT Frequency: 5x/week OT Frequency: 5x/week             Contractures Contractures Info: Not present    Additional Factors Info  Code Status, Allergies Code Status Info: fullcode Allergies Info: No Known Allergies           Current Medications (12/10/2017):  This is the current hospital active medication list Current Facility-Administered Medications  Medication Dose Route Frequency  Provider Last Rate Last Dose  . 0.9 %  sodium chloride infusion   Intravenous Continuous Donia Ast, [place] 75 mL/hr at 12/10/17 5638    . alum & mag hydroxide-simeth (MAALOX/MYLANTA) 200-200-20 MG/5ML suspension 30 mL  30 mL Oral Q4H PRN Donia Ast, [place]      . aspirin EC tablet 325 mg  325 mg Oral BID Donia Ast, [place]   325 mg at 12/10/17 0859  . bisacodyl (DULCOLAX) EC tablet 5 mg  5 mg Oral Daily PRN Donia Ast, [place]      . diphenhydrAMINE (BENADRYL) 12.5 MG/5ML elixir 12.5-25 mg  12.5-25 mg Oral Q4H PRN Donia Ast, [place]      . docusate sodium (COLACE) capsule 100 mg  100 mg Oral BID Donia Ast, [place]   100 mg at 12/10/17 0859  . ferrous sulfate tablet 325 mg  325 mg Oral TID PC Donia Ast, [place]   325 mg at 12/10/17 0859  . gabapentin (NEURONTIN) capsule 300 mg  300 mg Oral TID Donia Ast, [place]   300 mg at 12/10/17 0859  . HYDROmorphone (DILAUDID) injection 0.5-1 mg  0.5-1 mg Intravenous Q4H PRN Donia Ast, [place]      . levothyroxine (SYNTHROID, LEVOTHROID) tablet 88 mcg  88 mcg Oral QAC breakfast Donia Ast, [place]   88 mcg at 12/10/17 7564  . menthol-cetylpyridinium (CEPACOL) lozenge 3 mg  1 lozenge  Oral PRN Donia Ast, [place]       Or  . phenol [facility]) mouth spray 1 spray  1 spray Mouth/Throat PRN Donia Ast, [place]      . methocarbamol (ROBAXIN) tablet 500 mg  500 mg Oral Q6H PRN Donia Ast, [place]   500 mg at 12/09/17 1308   Or  . methocarbamol (ROBAXIN) 500 mg in dextrose 5 % 50 mL IVPB  500 mg Intravenous Q6H PRN Donia Ast, [place]      . metoCLOPramide (REGLAN) tablet 5-10 mg  5-10 mg Oral Q8H PRN Donia Ast, [place]       Or  . metoCLOPramide (REGLAN) injection 5-10 mg  5-10 mg Intravenous Q8H PRN Donia Ast, [place]      . ondansetron [facility]) tablet 4 mg  4 mg Oral Q6H PRN Donia Ast, [place]       Or  . ondansetron [facility]) injection 4 mg  4 mg Intravenous Q6H PRN Donia Ast, [place]      . oxyCODONE (Oxy IR/ROXICODONE) immediate release tablet 5-10 mg  5-10 mg Oral Q4H PRN Donia Ast, [place]   5 mg at 12/10/17 0913  . pantoprazole (PROTONIX) EC tablet 40 mg  40 mg Oral Daily Donia Ast, [place]   40 mg at 12/10/17 6269  . senna-docusate (Senokot-S) tablet 1 tablet  1 tablet Oral QHS PRN Donia Ast, [place]      . sodium phosphate (FLEET) 7-19 GM/118ML enema 1 enema  1 enema Rectal Once PRN Donia Ast, [place]      . traMADol Veatrice Bourbon) tablet 50 mg  50 mg Oral Q6H Carlyon Shadow [place], [place]   50 mg at 12/10/17 4854  . triamterene-hydrochlorothiazide (DYAZIDE) 37.5-25 MG per capsule 1 capsule  1 capsule Oral Daily Donia Ast, [place]   Stopped at 12/09/17 1514  . zolpidem (AMBIEN) tablet 5 mg  5 mg Oral QHS PRN Donia Ast, [place]         Discharge Medications: Please see discharge summary for a list of discharge medications.  Relevant Imaging Results:  Relevant Lab Results:   Additional Information OEV:[id number]  Lia Hopping, LCSW

## 2017-12-10 NOTE — Progress Notes (Signed)
Physical Therapy Treatment Patient Details Name: NOREEN MACKINTOSH MRN: [id number] DOB: 1931-03-15 Today's Date: 12/10/2017    History of Present Illness Pt is a 84 YO female s/p R TKR on 9/23. PMH includes skin cancer, HTN, hyperthyroid, peripheral neuropathy. Surgical history includes cataracts extraction, R meniscus repair.     PT Comments    POD # 1 am session Assisted OOB to amb in hallway then performed some TKR TE's followed by ICE.    Follow Up Recommendations  Follow surgeon's recommendation for DC plan and follow-up therapies;Supervision for mobility/OOB;SNF     Equipment Recommendations  None recommended by PT    Recommendations for Other Services       Precautions / Restrictions Precautions Precautions: Knee;Fall Restrictions Weight Bearing Restrictions: No Other Position/Activity Restrictions: WBAT     Mobility  Bed Mobility Overal bed mobility: Needs Assistance Bed Mobility: Supine to Sit     Supine to sit: Min assist;HOB elevated     General bed mobility comments: Min assist for RLE management. Verbal cuing for using LLE to assist with scooting to EOB. Increased time and effort to get to EOB.   Transfers Overall transfer level: Needs assistance Equipment used: Rolling walker (2 wheeled) Transfers: Sit to/from Stand Sit to Stand: Min guard         General transfer comment: Min guard for safety.   VC's for proper hand placement.   Ambulation/Gait Ambulation/Gait assistance: Min guard Gait Distance (Feet): 45 Feet Assistive device: Rolling walker (2 wheeled) Gait Pattern/deviations: Step-to pattern;Step-through pattern;Decreased weight shift to right;Decreased stance time - right;Antalgic;Trunk flexed Gait velocity: decr    General Gait Details: required increased time and X 1 standing rest break    Stairs             Wheelchair Mobility    Modified Rankin (Stroke Patients Only)       Balance                                             Cognition Arousal/Alertness: Awake/alert Behavior During Therapy: WFL for tasks assessed/performed Overall Cognitive Status: Within Functional Limits for tasks assessed                                 General Comments: pleasant      Exercises   Total Knee Replacement TE's 10 reps B LE ankle pumps 10 reps towel squeezes 10 reps knee presses 10 reps heel slides  10 reps SAQ's 10 reps SLR's 10 reps ABD Followed by ICE    General Comments        Pertinent Vitals/Pain Pain Assessment: 0-10 Pain Score: 5  Pain Location: R knee  Pain Descriptors / Indicators: Aching;Sore;Operative site guarding Pain Intervention(s): Monitored during session;Repositioned;Ice applied    Home Living                      Prior Function            PT Goals (current goals can now be found in the care plan section)      Frequency    7X/week      PT Plan Current plan remains appropriate    Co-evaluation              AM-PAC PT "6 Clicks" Daily Activity  Outcome Measure  Difficulty turning over in bed (including adjusting bedclothes, sheets and blankets)?: A Lot Difficulty moving from lying on back to sitting on the side of the bed? : A Lot Difficulty sitting down on and standing up from a chair with arms (e.g., wheelchair, bedside commode, etc,.)?: A Lot Help needed moving to and from a bed to chair (including a wheelchair)?: A Lot Help needed walking in hospital room?: A Lot Help needed climbing 3-5 steps with a railing? : A Lot 6 Click Score: 12    End of Session Equipment Utilized During Treatment: Gait belt Activity Tolerance: Patient tolerated treatment well;No increased pain Patient left: in chair;with chair alarm set;with call bell/phone within reach;with family/visitor present;with SCD's reapplied Nurse Communication: Mobility status PT Visit Diagnosis: Other abnormalities of gait and mobility (R26.89);Difficulty in  walking, not elsewhere classified (R26.2)     Time: 9379-0240 PT Time Calculation (min) (ACUTE ONLY): 25 min  Charges:  $Gait Training: 8-22 mins $Therapeutic Exercise: 8-22 mins                     Rica Koyanagi  PTA Acute  Rehabilitation Services Pager      [phone] Office      [phone]

## 2017-12-10 NOTE — Progress Notes (Signed)
SPORTS MEDICINE AND JOINT REPLACEMENT  [name], MD    Carlyon Shadow, PA-C [place], [place],   [address]                             [phone]   PROGRESS NOTE  Subjective:  negative for Chest Pain  negative for Shortness of Breath  negative for Nausea/Vomiting   negative for Calf Pain  negative for Bowel Movement   Tolerating Diet: yes         Patient reports pain as 3 on 0-10 scale.    Objective: Vital signs in last 24 hours:    Patient Vitals for the past 24 hrs:  BP Temp Temp src Pulse Resp SpO2  12/10/17 0603 119/63 (!) 97.5 F (36.4 C) Oral 74 - 97 %  12/10/17 0232 128/62 (!) 97.5 F (36.4 C) Oral 68 17 97 %  12/09/17 2217 115/62 (!) 97.4 F (36.3 C) Oral 73 16 97 %  12/09/17 1715 (!) 123/58 - - 70 14 97 %  12/09/17 1509 (!) 113/54 97.6 F (36.4 C) Oral 76 16 100 %  12/09/17 1349 136/67 (!) 97.5 F (36.4 C) Oral 76 14 100 %  12/09/17 1256 (!) 123/58 98.6 F (37 C) Oral 74 14 100 %  12/09/17 1149 (!) 118/56 97.6 F (36.4 C) Oral 69 16 100 %  12/09/17 1130 - - - 71 16 100 %  12/09/17 1115 - 98.3 F (36.8 C) - - - -  12/09/17 1100 121/61 98.3 F (36.8 C) - 75 17 100 %  12/09/17 1045 (!) 119/59 - - 78 15 98 %  12/09/17 1030 (!) 120/57 - - 76 14 100 %  12/09/17 1023 (!) 116/54 (!) 97.5 F (36.4 C) - 75 16 100 %  12/09/17 0803 - - - 62 15 99 %  12/09/17 0802 - - - 64 11 99 %  12/09/17 0801 - - - (!) 59 13 99 %  12/09/17 0800 (!) 125/54 - - 62 10 98 %  12/09/17 0759 - - - 62 11 98 %  12/09/17 0758 - - - 62 (!) 9 100 %  12/09/17 0757 - - - 66 18 99 %  12/09/17 0756 134/71 - - 64 11 98 %  12/09/17 0755 - - - 65 11 97 %  12/09/17 0754 - - - 63 (!) 8 99 %  12/09/17 0753 - - - 68 10 98 %  12/09/17 0752 - - - 69 13 100 %  12/09/17 0751 - - - 65 10 98 %  12/09/17 0750 (!) 137/56 - - 66 (!) 9 96 %  12/09/17 0749 - - - 68 (!) 7 98 %  12/09/17 0748 - - - 67 14 100 %  12/09/17 0747 - - - 67 17 100 %  12/09/17 0746 - - - 64 14 100 %   12/09/17 0745 129/64 - - 65 16 100 %  12/09/17 0744 - - - 65 18 100 %  12/09/17 0743 - - - 63 13 100 %  12/09/17 0742 - - - 65 15 100 %  12/09/17 0741 - - - 64 13 100 %  12/09/17 0740 (!) 136/56 - - 65 14 100 %    @flow {1959:LAST@   Intake/Output from previous day:   09/23 0701 - 09/24 0700 In: 3642.4 [P.O.:580; I.V.:2962.4] Out: 2000 [Urine:1950]   Intake/Output this shift:   09/23 1901 - 09/24 0700  In: 662.4 [P.O.:100; I.V.:562.4] Out: 400 [Urine:400]   Intake/Output      09/23 0701 - 09/24 0700   P.O. 580   I.V. (mL/kg) 2962.4 (43.1)   IV Piggyback 100   Total Intake(mL/kg) 3642.4 (53)   Urine (mL/kg/hr) 1950 (1.2)   Blood 50   Total Output 2000   Net +1642.4          LABORATORY DATA: Recent Labs    12/04/17 1116 12/10/17 0441  WBC 4.8 8.4  HGB 12.9 10.5*  HCT 40.3 32.2*  PLT 259 205   Recent Labs    12/04/17 1116 12/10/17 0441  NA 141 143  K 4.4 3.9  CL 101 106  CO2 32 30  BUN 15 16  CREATININE 0.99 0.88  GLUCOSE 105* 126*  CALCIUM 9.6 7.9*   No results found for: INR, PROTIME  Examination:  General appearance: alert, cooperative and no distress Extremities: extremities normal, atraumatic, no cyanosis or edema  Wound Exam: clean, dry, intact   Drainage:  None: wound tissue dry  Motor Exam: Quadriceps and Hamstrings Intact  Sensory Exam: Superficial Peroneal, Deep Peroneal and Tibial normal   Assessment:    1 Day Post-Op  Procedure(s) (LRB): RIGHT TOTAL KNEE ARTHROPLASTY (Right)  ADDITIONAL DIAGNOSIS:  Active Problems:   S/P total knee replacement     Plan: Physical Therapy as ordered Weight Bearing as Tolerated (WBAT)  DVT Prophylaxis:  Aspirin  DISCHARGE PLAN: Home  DISCHARGE NEEDS: HHPT   Patient doing well. Expected D/C to SNF ([facility]) tomorrow.       Anticipated LOS equal to or greater than 2 midnights due to - Age 82 and older with one or more of the following:  - Obesity  - Expected need for hospital services  (PT, OT, Nursing) required for safe  discharge  - Anticipated need for postoperative skilled nursing care or inpatient rehab   Donia Ast 12/10/2017, 6:57 AM

## 2017-12-10 NOTE — Op Note (Signed)
TOTAL KNEE REPLACEMENT OPERATIVE NOTE:  12/09/2017  5:17 PM  PATIENT:  Patricia Mason  84 y.o. female  PRE-OPERATIVE DIAGNOSIS:  Osteoarthritis RT. Knee  POST-OPERATIVE DIAGNOSIS:  Osteoarthritis RT. Knee  PROCEDURE:  Procedure(s): RIGHT TOTAL KNEE ARTHROPLASTY  SURGEON:  Surgeon(s): Vickey Huger, MD  PHYSICIAN ASSISTANT: Nehemiah Massed, PA-C  ANESTHESIA:   spinal  SPECIMEN: None  COUNTS:  Correct  TOURNIQUET:   Total Tourniquet Time Documented: Thigh (Right) - 41 minutes Total: Thigh (Right) - 41 minutes   DICTATION:  Indication for procedure:    The patient is a 84 y.o. female who has failed conservative treatment for Osteoarthritis RT. Knee.  Informed consent was obtained prior to anesthesia. The risks versus benefits of the operation were explain and in a way the patient can, and did, understand.   On the implant demand matching protocol, this patient scored 10.  Therefore, this patient was not receive a polyethylene insert with vitamin E which is a high demand implant.  Description of procedure:     The patient was taken to the operating room and placed under anesthesia.  The patient was positioned in the usual fashion taking care that all body parts were adequately padded and/or protected.  A tourniquet was applied and the leg prepped and draped in the usual sterile fashion.  The extremity was exsanguinated with the esmarch and tourniquet inflated to 350 mmHg.  Pre-operative range of motion was normal.  The knee was in 5 degree of mild varus.  A midline incision approximately 6-7 inches long was made with a #10 blade.  A new blade was used to make a parapatellar arthrotomy going 2-3 cm into the quadriceps tendon, over the patella, and alongside the medial aspect of the patellar tendon.  A synovectomy was then performed with the #10 blade and forceps. I then elevated the deep MCL off the medial tibial metaphysis subperiosteally around to the semimembranosus attachment.     I everted the patella and used calipers to measure patellar thickness.  I used the reamer to ream down to appropriate thickness to recreate the native thickness.  I then removed excess bone with the rongeur and sagittal saw.  I used the appropriately sized template and drilled the three lug holes.  I then put the trial in place and measured the thickness with the calipers to ensure recreation of the native thickness.  The trial was then removed and the patella subluxed and the knee brought into flexion.  A homan retractor was place to retract and protect the patella and lateral structures.  A Z-retractor was place medially to protect the medial structures.  The extra-medullary alignment system was used to make cut the tibial articular surface perpendicular to the anamotic axis of the tibia and in 3 degrees of posterior slope.  The cut surface and alignment jig was removed.  I then used the intramedullary alignment guide to make a 6 valgus cut on the distal femur.  I then marked out the epicondylar axis on the distal femur.  The posterior condylar axis measured 3 degrees.  I then used the anterior referencing sizer and measured the femur to be a size 7.  The 4-In-1 cutting block was screwed into place in external rotation matching the posterior condylar angle, making our cuts perpendicular to the epicondylar axis.  Anterior, posterior and chamfer cuts were made with the sagittal saw.  The cutting block and cut pieces were removed.  A lamina spreader was placed in 90 degrees of flexion.  The ACL, PCL, menisci, and posterior condylar osteophytes were removed.  A 12 mm spacer blocked was found to offer good flexion and extension gap balance after mild in degree releasing.   The scoop retractor was then placed and the femoral finishing block was pinned in place.  The small sagittal saw was used as well as the lug drill to finish the femur.  The block and cut surfaces were removed and the medullary canal hole  filled with autograft bone from the cut pieces.  The tibia was delivered forward in deep flexion and external rotation.  A size D tray was selected and pinned into place centered on the medial 1/3 of the tibial tubercle.  The reamer and keel was used to prepare the tibia through the tray.    I then trialed with the size 7 femur, size D tibia, a 12 mm insert and the 32 patella.  I had excellent flexion/extension gap balance, excellent patella tracking.  Flexion was full and beyond 120 degrees; extension was zero.  These components were chosen and the staff opened them to me on the back table while the knee was lavaged copiously and the cement mixed.  The soft tissue was infiltrated with 60cc of exparel 1.3% through a 21 gauge needle.  I cemented in the components and removed all excess cement.  The polyethylene tibial component was snapped into place and the knee placed in extension while cement was hardening.  The capsule was infilltrated with a 60cc exparel/marcaine/saline mixture.   Once the cement was hard, the tourniquet was let down.  Hemostasis was obtained.  The arthrotomy was closed using a #1 stratofix running suture.  The deep soft tissues were closed with #0 vicryls and the subcuticular layer closed with #2-0 vicryl.  The skin was reapproximated and closed with 3.0 Monocryl.  The wound was covered with steristrips, aquacel dressing, and a TED stocking.   The patient was then awakened, extubated, and taken to the recovery room in stable condition.  BLOOD LOSS:  338SN COMPLICATIONS:  None.  PLAN OF CARE: Admit to inpatient   PATIENT DISPOSITION:  PACU - hemodynamically stable.   Delay start of Pharmacological VTE agent (>24hrs) due to surgical blood loss or risk of bleeding:  not applicable  Please fax a copy of this op note to my office at [phone] (please only include page 1 and 2 of the Case Information op note)

## 2017-12-10 NOTE — Clinical Social Work Note (Signed)
Clinical Social Work Assessment  Patient Details  Name: Patricia Mason MRN: [id number] Date of Birth: 03/23/1930  Date of referral:  12/10/17               Reason for consult:  Facility Placement                Permission sought to share information with:  Facility Sport and exercise psychologist, Family Supports Permission granted to share information::  Yes, Verbal Permission Granted  Name::     Producer, television/film/video::  SNF   Relationship::  Son/ POA   Contact Information:     Housing/Transportation Living arrangements for the past 2 months:  [place] of Information:  Patient, Adult Children Patient Interpreter Needed:  None Criminal Activity/Legal Involvement Pertinent to Current Situation/Hospitalization:  No - Comment as needed Significant Relationships:  Adult Children Lives with:  Self Do you feel safe going back to the place where you live?  Yes Need for family participation in patient care:  Yes (Comment)  Care giving concerns:   S/p Total Knee replacement  SNF placement for short rehab.   Social Worker assessment / plan:  CSW discussed discharge planning with patient and son at bedside. Patient plan is to got to [facility] for short term rehab before returning home. Patient lives alone and will need additional support before transitioning home. Patient son reports prior to surgery patient used a cane and was fairly independent. She completed her own ADL's. Patient and son are hopeful physical therapy will allow patient to regain her strength.   Plan: [facility]  Employment status:  Retired Forensic scientist:  Other (Comment Required)(Health Secretary/administrator ) PT Recommendations:  [place] / Referral to community resources:  [place]  Patient/Family's Response to care:  Agreeable and Responding well to care.   Patient/Family's Understanding of and Emotional Response to Diagnosis, Current Treatment, and  Prognosis: Patient has a good understanding of diagnosis and follow up care. Patient son at bedside well versed in patient care as well.   Emotional Assessment Appearance:  Appears stated age Attitude/Demeanor/Rapport:    Affect (typically observed):  Accepting, Pleasant Orientation:  Oriented to Self, Oriented to Place, Oriented to  Time, Oriented to Situation Alcohol / Substance use:  Not Applicable Psych involvement (Current and /or in the community):  No (Comment)  Discharge Needs  Concerns to be addressed:  Discharge Planning Concerns Readmission within the last 30 days:  No Current discharge risk:  Dependent with Mobility, Physical Impairment Barriers to Discharge:  [place], [place] 12/10/2017, 9:51 AM

## 2017-12-10 NOTE — Progress Notes (Signed)
Physical Therapy Treatment Patient Details Name: Patricia Mason MRN: [id number] DOB: 06-04-1930 Today's Date: 12/10/2017    History of Present Illness Pt is a 84 YO female s/p R TKR on 9/23. PMH includes skin cancer, HTN, hyperthyroid, peripheral neuropathy. Surgical history includes cataracts extraction, R meniscus repair.     PT Comments    POD # 1 pm session Assisted to bathroom, assisted with amb in hallway then back to bed with ICE.  Pt plans to D/C to SNF as she lives home alone.    Follow Up Recommendations  Follow surgeon's recommendation for DC plan and follow-up therapies;Supervision for mobility/OOB;SNF     Equipment Recommendations  None recommended by PT    Recommendations for Other Services       Precautions / Restrictions Precautions Precautions: Knee;Fall Restrictions Weight Bearing Restrictions: No Other Position/Activity Restrictions: WBAT     Mobility  Bed Mobility Overal bed mobility: Needs Assistance Bed Mobility: Sit to Supine     Supine to sit: Min assist;HOB elevated     General bed mobility comments: assisted back to bed   Transfers Overall transfer level: Needs assistance Equipment used: Rolling walker (2 wheeled) Transfers: Sit to/from Stand Sit to Stand: Min guard         General transfer comment: Min guard for safety.   VC's for proper hand placement.   Also assisted with toilet transfer   Ambulation/Gait Ambulation/Gait assistance: Min guard Gait Distance (Feet): 32 Feet Assistive device: Rolling walker (2 wheeled) Gait Pattern/deviations: Step-to pattern;Step-through pattern;Decreased weight shift to right;Decreased stance time - right;Antalgic;Trunk flexed Gait velocity: decr    General Gait Details: decreased distance due to increased c/o fatigue   Stairs             Wheelchair Mobility    Modified Rankin (Stroke Patients Only)       Balance                                             Cognition Arousal/Alertness: Awake/alert Behavior During Therapy: WFL for tasks assessed/performed Overall Cognitive Status: Within Functional Limits for tasks assessed                                 General Comments: pleasant      Exercises      General Comments        Pertinent Vitals/Pain Pain Assessment: 0-10 Pain Score: 5  Pain Location: R knee  Pain Descriptors / Indicators: Aching;Sore;Operative site guarding Pain Intervention(s): Monitored during session;Repositioned;Ice applied    Home Living                      Prior Function            PT Goals (current goals can now be found in the care plan section)      Frequency    7X/week      PT Plan Current plan remains appropriate    Co-evaluation              AM-PAC PT "6 Clicks" Daily Activity  Outcome Measure  Difficulty turning over in bed (including adjusting bedclothes, sheets and blankets)?: A Lot Difficulty moving from lying on back to sitting on the side of the bed? : A Lot Difficulty sitting down on and standing  up from a chair with arms (e.g., wheelchair, bedside commode, etc,.)?: A Lot Help needed moving to and from a bed to chair (including a wheelchair)?: A Lot Help needed walking in hospital room?: A Lot Help needed climbing 3-5 steps with a railing? : A Lot 6 Click Score: 12    End of Session Equipment Utilized During Treatment: Gait belt Activity Tolerance: Patient tolerated treatment well;No increased pain Patient left: in chair;with chair alarm set;with call bell/phone within reach;with family/visitor present;with SCD's reapplied Nurse Communication: Mobility status PT Visit Diagnosis: Other abnormalities of gait and mobility (R26.89);Difficulty in walking, not elsewhere classified (R26.2)     Time: 1415-1440 PT Time Calculation (min) (ACUTE ONLY): 25 min  Charges:  $Gait Training: 8-22 mins $Therapeutic Activity: 8-22 mins                      Rica Koyanagi  PTA Acute  Rehabilitation Services Pager      [phone] Office      [phone]

## 2017-12-10 NOTE — Care Management Note (Signed)
Case Management Note  Patient Details  Name: Patricia Mason MRN: [id number] Date of Birth: 1931/02/15  Subjective/Objective:   Plan for d/c to SNF, discharge planning per CSW. [phone]                 Action/Plan:   Expected Discharge Date:  12/11/17               Expected Discharge Plan:  Skilled Nursing Facility  In-House Referral:  Clinical Social Work  Discharge planning Services  CM Consult  Post Acute Care Choice:  NA Choice offered to:  Patient  DME Arranged:  N/A DME Agency:  NA  HH Arranged:  NA HH Agency:  NA  Status of Service:  Completed, signed off  If discussed at [employer], dates discussed:    Additional Comments:  Guadalupe Maple, RN 12/10/2017, 9:45 AM

## 2017-12-11 DIAGNOSIS — E785 Hyperlipidemia, unspecified: Secondary | ICD-10-CM | POA: Diagnosis not present

## 2017-12-11 DIAGNOSIS — G8918 Other acute postprocedural pain: Secondary | ICD-10-CM | POA: Diagnosis not present

## 2017-12-11 DIAGNOSIS — Z96651 Presence of right artificial knee joint: Secondary | ICD-10-CM | POA: Diagnosis not present

## 2017-12-11 DIAGNOSIS — R2689 Other abnormalities of gait and mobility: Secondary | ICD-10-CM | POA: Diagnosis not present

## 2017-12-11 DIAGNOSIS — R112 Nausea with vomiting, unspecified: Secondary | ICD-10-CM | POA: Diagnosis not present

## 2017-12-11 DIAGNOSIS — E039 Hypothyroidism, unspecified: Secondary | ICD-10-CM | POA: Diagnosis not present

## 2017-12-11 DIAGNOSIS — G609 Hereditary and idiopathic neuropathy, unspecified: Secondary | ICD-10-CM | POA: Diagnosis not present

## 2017-12-11 DIAGNOSIS — Z742 Need for assistance at home and no other household member able to render care: Secondary | ICD-10-CM | POA: Diagnosis not present

## 2017-12-11 DIAGNOSIS — I10 Essential (primary) hypertension: Secondary | ICD-10-CM | POA: Diagnosis not present

## 2017-12-11 DIAGNOSIS — T8859XD Other complications of anesthesia, subsequent encounter: Secondary | ICD-10-CM | POA: Diagnosis not present

## 2017-12-11 DIAGNOSIS — Z471 Aftercare following joint replacement surgery: Secondary | ICD-10-CM | POA: Diagnosis not present

## 2017-12-11 DIAGNOSIS — E0590 Thyrotoxicosis, unspecified without thyrotoxic crisis or storm: Secondary | ICD-10-CM | POA: Diagnosis not present

## 2017-12-11 DIAGNOSIS — M1990 Unspecified osteoarthritis, unspecified site: Secondary | ICD-10-CM | POA: Diagnosis not present

## 2017-12-11 DIAGNOSIS — M6259 Muscle wasting and atrophy, not elsewhere classified, multiple sites: Secondary | ICD-10-CM | POA: Diagnosis not present

## 2017-12-11 DIAGNOSIS — M6289 Other specified disorders of muscle: Secondary | ICD-10-CM | POA: Diagnosis not present

## 2017-12-11 DIAGNOSIS — Z4889 Encounter for other specified surgical aftercare: Secondary | ICD-10-CM | POA: Diagnosis not present

## 2017-12-11 DIAGNOSIS — D649 Anemia, unspecified: Secondary | ICD-10-CM | POA: Diagnosis not present

## 2017-12-11 DIAGNOSIS — C4492 Squamous cell carcinoma of skin, unspecified: Secondary | ICD-10-CM | POA: Diagnosis not present

## 2017-12-11 DIAGNOSIS — M810 Age-related osteoporosis without current pathological fracture: Secondary | ICD-10-CM | POA: Diagnosis not present

## 2017-12-11 DIAGNOSIS — R262 Difficulty in walking, not elsewhere classified: Secondary | ICD-10-CM | POA: Diagnosis not present

## 2017-12-11 LAB — CBC
HEMATOCRIT: 32.6 % — AB (ref 36.0–46.0)
Hemoglobin: 10.6 g/dL — ABNORMAL LOW (ref 12.0–15.0)
MCH: 32.1 pg (ref 26.0–34.0)
MCHC: 32.5 g/dL (ref 30.0–36.0)
MCV: 98.8 fL (ref 78.0–100.0)
Platelets: 212 10*3/uL (ref 150–400)
RBC: 3.3 MIL/uL — ABNORMAL LOW (ref 3.87–5.11)
RDW: 13.2 % (ref 11.5–15.5)
WBC: 8.7 10*3/uL (ref 4.0–10.5)

## 2017-12-11 NOTE — Progress Notes (Signed)
Physical Therapy Treatment Patient Details Name: Patricia Mason MRN: [id number] DOB: January 10, 1931 Today's Date: 12/11/2017    History of Present Illness Pt is a 84 YO female s/p R TKR on 9/23. PMH includes skin cancer, HTN, hyperthyroid, peripheral neuropathy. Surgical history includes cataracts extraction, R meniscus repair.     PT Comments    POD # 2 Assisted OOB to amb to bathroom then a limited distance in hallway due to increased c/o fatigue.  Returned to room and performed some TKR TE's followed by ICE.   Follow Up Recommendations  Follow surgeon's recommendation for DC plan and follow-up therapies;Supervision for mobility/OOB;SNF     Equipment Recommendations  None recommended by PT    Recommendations for Other Services       Precautions / Restrictions Precautions Precautions: Knee;Fall Restrictions Weight Bearing Restrictions: No Other Position/Activity Restrictions: WBAT     Mobility  Bed Mobility Overal bed mobility: Needs Assistance Bed Mobility: Supine to Sit     Supine to sit: Min assist;HOB elevated     General bed mobility comments: assisted R LE and increased time  Transfers Overall transfer level: Needs assistance Equipment used: Rolling walker (2 wheeled) Transfers: Sit to/from Stand Sit to Stand: Min guard         General transfer comment: Min guard for safety.   VC's for proper hand placement.   Also assisted with toilet transfer   Ambulation/Gait Ambulation/Gait assistance: Min guard Gait Distance (Feet): 28 Feet Assistive device: Rolling walker (2 wheeled) Gait Pattern/deviations: Step-to pattern;Step-through pattern;Decreased weight shift to right;Decreased stance time - right;Antalgic;Trunk flexed Gait velocity: decr    General Gait Details: decreased distance due to increased c/o fatigue   Stairs             Wheelchair Mobility    Modified Rankin (Stroke Patients Only)       Balance                                             Cognition Arousal/Alertness: Awake/alert Behavior During Therapy: WFL for tasks assessed/performed Overall Cognitive Status: Within Functional Limits for tasks assessed                                 General Comments: pleasant      Exercises   Total Knee Replacement TE's 10 reps B LE ankle pumps 10 reps towel squeezes 10 reps knee presses 10 reps heel slides  10 reps SLR's 10 reps ABD Followed by ICE    General Comments        Pertinent Vitals/Pain Pain Assessment: 0-10 Pain Score: 5  Pain Location: R knee  Pain Descriptors / Indicators: Aching;Sore;Operative site guarding Pain Intervention(s): Repositioned;Ice applied;Monitored during session    Home Living                      Prior Function            PT Goals (current goals can now be found in the care plan section) Progress towards PT goals: Progressing toward goals    Frequency           PT Plan Current plan remains appropriate    Co-evaluation              AM-PAC PT "6 Clicks" Daily Activity  Outcome Measure  Difficulty turning over in bed (including adjusting bedclothes, sheets and blankets)?: A Lot Difficulty moving from lying on back to sitting on the side of the bed? : A Lot Difficulty sitting down on and standing up from a chair with arms (e.g., wheelchair, bedside commode, etc,.)?: A Lot Help needed moving to and from a bed to chair (including a wheelchair)?: A Lot Help needed walking in hospital room?: A Lot   6 Click Score: 10    End of Session Equipment Utilized During Treatment: Gait belt Activity Tolerance: Patient tolerated treatment well;No increased pain Patient left: in chair;with chair alarm set;with call bell/phone within reach;with family/visitor present;with SCD's reapplied Nurse Communication: Mobility status PT Visit Diagnosis: Other abnormalities of gait and mobility (R26.89);Difficulty in walking, not  elsewhere classified (R26.2)     Time: 1035-1100 PT Time Calculation (min) (ACUTE ONLY): 25 min  Charges:  $Gait Training: 8-22 mins $Therapeutic Exercise: 8-22 mins                     Rica Koyanagi  PTA Acute  Rehabilitation Services Pager      [phone] Office      [phone]

## 2017-12-11 NOTE — Plan of Care (Signed)

## 2017-12-11 NOTE — Discharge Summary (Signed)
SPORTS MEDICINE & JOINT REPLACEMENT   Patricia Mulch, MD   Patricia Shadow, PA-C [place], [place],   [address]                             [phone]  PATIENT ID: Patricia Mason        MRN:  [id number]          DOB/AGE: 10/04/1930 / 84 y.o.    DISCHARGE SUMMARY  ADMISSION DATE:    12/09/2017 DISCHARGE DATE:   12/11/2017   ADMISSION DIAGNOSIS: Osteoarthritis RT. Knee    DISCHARGE DIAGNOSIS:  Osteoarthritis RT. Knee    ADDITIONAL DIAGNOSIS: Active Problems:   S/P total knee replacement  Past Medical History:  Diagnosis Date  . Cancer (HCC)    Skin-Squamous cell  . Complication of anesthesia   . Hypertension   . Hyperthyroidism   . PONV (postoperative nausea and vomiting)     PROCEDURE: Procedure(s): RIGHT TOTAL KNEE ARTHROPLASTY on 12/09/2017  CONSULTS:    HISTORY:  See H&P in chart  HOSPITAL COURSE:  Patricia Mason is a 84 y.o. admitted on 12/09/2017 and found to have a diagnosis of Osteoarthritis RT. Knee.  After appropriate laboratory studies were obtained  they were taken to the operating room on 12/09/2017 and underwent Procedure(s): RIGHT TOTAL KNEE ARTHROPLASTY.   They were given perioperative antibiotics:  Anti-infectives (From admission, onward)   Start     Dose/Rate Route Frequency Ordered Stop   12/09/17 1400  ceFAZolin (ANCEF) IVPB 2g/100 mL premix     2 g 200 mL/hr over 30 Minutes Intravenous Every 6 hours 12/09/17 1147 12/09/17 2015   12/09/17 0630  ceFAZolin (ANCEF) IVPB 2g/100 mL premix     2 g 200 mL/hr over 30 Minutes Intravenous On call to O.R. 12/09/17 0617 12/09/17 0829   12/09/17 0625  ceFAZolin (ANCEF) 2-4 GM/100ML-% IVPB    Note to Pharmacy:  Charmayne Sheer   : cabinet override      12/09/17 1607 12/09/17 0829    .  Patient given tranexamic acid IV or topical and exparel intra-operatively.  Tolerated the procedure well.    POD# 1: Vital signs were stable.  Patient denied Chest pain, shortness of breath, or calf pain.   Patient was started on Aspirin twice daily at 8am.  Consults to PT, OT, and care management were made.  The patient was weight bearing as tolerated.  CPM was placed on the operative leg 0-90 degrees for 6-8 hours a day. When out of the CPM, patient was placed in the foam block to achieve full extension. Incentive spirometry was taught.  Dressing was changed.       POD #2, Continued  PT for ambulation and exercise program.  IV saline locked.  O2 discontinued.    The remainder of the hospital course was dedicated to ambulation and strengthening.   The patient was discharged on 2 Days Post-Op in  Good condition.  Blood products given:none  DIAGNOSTIC STUDIES: Recent vital signs:  Patient Vitals for the past 24 hrs:  BP Temp Temp src Pulse Resp SpO2  12/11/17 0644 (!) 156/78 98.5 F (36.9 C) Oral 79 16 100 %  12/10/17 2226 (!) 136/55 98.3 F (36.8 C) Oral 73 17 99 %  12/10/17 1324 (!) 125/56 (!) 97.5 F (36.4 C) Oral 69 16 97 %  12/10/17 1040 (!) 108/53 97.6 F (36.4 C) Oral 70 16 96 %  Recent laboratory studies: Recent Labs    12/04/17 1116 12/10/17 0441 12/11/17 0453  WBC 4.8 8.4 8.7  HGB 12.9 10.5* 10.6*  HCT 40.3 32.2* 32.6*  PLT 259 205 212   Recent Labs    12/04/17 1116 12/10/17 0441  NA 141 143  K 4.4 3.9  CL 101 106  CO2 32 30  BUN 15 16  CREATININE 0.99 0.88  GLUCOSE 105* 126*  CALCIUM 9.6 7.9*   No results found for: INR, PROTIME   Recent Radiographic Studies :  No results found.  DISCHARGE INSTRUCTIONS: Discharge Instructions    CPM   Complete by:  As directed    Continuous passive motion machine (CPM):      Use the CPM from 0 to 90 for 4-6 hours per day.      You may increase by 10 per day.  You may break it up into 2 or 3 sessions per day.      Use CPM for 2 weeks or until you are told to stop.   Call MD / Call 911   Complete by:  As directed    If you experience chest pain or shortness of breath, CALL 911 and be transported to the hospital  emergency room.  If you develope a fever above 101 F, pus (white drainage) or increased drainage or redness at the wound, or calf pain, call your surgeon's office.   Constipation Prevention   Complete by:  As directed    Drink plenty of fluids.  Prune juice may be helpful.  You may use a stool softener, such as Colace (over the counter) 100 mg twice a day.  Use MiraLax (over the counter) for constipation as needed.   Diet - low sodium heart healthy   Complete by:  As directed    Discharge instructions   Complete by:  As directed    INSTRUCTIONS AFTER JOINT REPLACEMENT   Remove items at home which could result in a fall. This includes throw rugs or furniture in walking pathways ICE to the affected joint every three hours while awake for 30 minutes at a time, for at least the first 3-5 days, and then as needed for pain and swelling.  Continue to use ice for pain and swelling. You may notice swelling that will progress down to the foot and ankle.  This is normal after surgery.  Elevate your leg when you are not up walking on it.   Continue to use the breathing machine you got in the hospital (incentive spirometer) which will help keep your temperature down.  It is common for your temperature to cycle up and down following surgery, especially at night when you are not up moving around and exerting yourself.  The breathing machine keeps your lungs expanded and your temperature down.   DIET:  As you were doing prior to hospitalization, we recommend a well-balanced diet.  DRESSING / WOUND CARE / SHOWERING  Keep the surgical dressing until follow up.  The dressing is water proof, so you can shower without any extra covering.  IF THE DRESSING FALLS OFF or the wound gets wet inside, change the dressing with sterile gauze.  Please use good hand washing techniques before changing the dressing.  Do not use any lotions or creams on the incision until instructed by your surgeon.    ACTIVITY  Increase  activity slowly as tolerated, but follow the weight bearing instructions below.   No driving for 6 weeks or until further direction  given by your physician.  You cannot drive while taking narcotics.  No lifting or carrying greater than 10 lbs. until further directed by your surgeon. Avoid periods of inactivity such as sitting longer than an hour when not asleep. This helps prevent blood clots.  You may return to work once you are authorized by your doctor.     WEIGHT BEARING   Weight bearing as tolerated with assist device (walker, cane, etc) as directed, use it as long as suggested by your surgeon or therapist, typically at least 4-6 weeks.   EXERCISES  Results after joint replacement surgery are often greatly improved when you follow the exercise, range of motion and muscle strengthening exercises prescribed by your doctor. Safety measures are also important to protect the joint from further injury. Any time any of these exercises cause you to have increased pain or swelling, decrease what you are doing until you are comfortable again and then slowly increase them. If you have problems or questions, call your caregiver or physical therapist for advice.   Rehabilitation is important following a joint replacement. After just a few days of immobilization, the muscles of the leg can become weakened and shrink (atrophy).  These exercises are designed to build up the tone and strength of the thigh and leg muscles and to improve motion. Often times heat used for twenty to thirty minutes before working out will loosen up your tissues and help with improving the range of motion but do not use heat for the first two weeks following surgery (sometimes heat can increase post-operative swelling).   These exercises can be done on a training (exercise) mat, on the floor, on a table or on a bed. Use whatever works the best and is most comfortable for you.    Use music or television while you are exercising so  that the exercises are a pleasant break in your day. This will make your life better with the exercises acting as a break in your routine that you can look forward to.   Perform all exercises about fifteen times, three times per day or as directed.  You should exercise both the operative leg and the other leg as well.   Exercises include:   Quad Sets - Tighten up the muscle on the front of the thigh (Quad) and hold for 5-10 seconds.   Straight Leg Raises - With your knee straight (if you were given a brace, keep it on), lift the leg to 60 degrees, hold for 3 seconds, and slowly lower the leg.  Perform this exercise against resistance later as your leg gets stronger.  Leg Slides: Lying on your back, slowly slide your foot toward your buttocks, bending your knee up off the floor (only go as far as is comfortable). Then slowly slide your foot back down until your leg is flat on the floor again.  Angel Wings: Lying on your back spread your legs to the side as far apart as you can without causing discomfort.  Hamstring Strength:  Lying on your back, push your heel against the floor with your leg straight by tightening up the muscles of your buttocks.  Repeat, but this time bend your knee to a comfortable angle, and push your heel against the floor.  You may put a pillow under the heel to make it more comfortable if necessary.   A rehabilitation program following joint replacement surgery can speed recovery and prevent re-injury in the future due to weakened muscles. Contact your doctor  or a physical therapist for more information on knee rehabilitation.    CONSTIPATION  Constipation is defined medically as fewer than three stools per week and severe constipation as less than one stool per week.  Even if you have a regular bowel pattern at home, your normal regimen is likely to be disrupted due to multiple reasons following surgery.  Combination of anesthesia, postoperative narcotics, change in appetite and  fluid intake all can affect your bowels.   YOU MUST use at least one of the following options; they are listed in order of increasing strength to get the job done.  They are all available over the counter, and you may need to use some, POSSIBLY even all of these options:    Drink plenty of fluids (prune juice may be helpful) and high fiber foods Colace 100 mg by mouth twice a day  Senokot for constipation as directed and as needed Dulcolax (bisacodyl), take with full glass of water  Miralax (polyethylene glycol) once or twice a day as needed.  If you have tried all these things and are unable to have a bowel movement in the first 3-4 days after surgery call either your surgeon or your primary doctor.    If you experience loose stools or diarrhea, hold the medications until you stool forms back up.  If your symptoms do not get better within 1 week or if they get worse, check with your doctor.  If you experience "the worst abdominal pain ever" or develop nausea or vomiting, please contact the office immediately for further recommendations for treatment.   ITCHING:  If you experience itching with your medications, try taking only a single pain pill, or even half a pain pill at a time.  You can also use Benadryl over the counter for itching or also to help with sleep.   TED HOSE STOCKINGS:  Use stockings on both legs until for at least 2 weeks or as directed by physician office. They may be removed at night for sleeping.  MEDICATIONS:  See your medication summary on the "After Visit Summary" that nursing will review with you.  You may have some home medications which will be placed on hold until you complete the course of blood thinner medication.  It is important for you to complete the blood thinner medication as prescribed.  PRECAUTIONS:  If you experience chest pain or shortness of breath - call 911 immediately for transfer to the hospital emergency department.   If you develop a fever greater  that 101 F, purulent drainage from wound, increased redness or drainage from wound, foul odor from the wound/dressing, or calf pain - CONTACT YOUR SURGEON.                                                   FOLLOW-UP APPOINTMENTS:  If you do not already have a post-op appointment, please call the office for an appointment to be seen by your surgeon.  Guidelines for how soon to be seen are listed in your "After Visit Summary", but are typically between 1-4 weeks after surgery.  OTHER INSTRUCTIONS:   Knee Replacement:  Do not place pillow under knee, focus on keeping the knee straight while resting. CPM instructions: 0-90 degrees, 2 hours in the morning, 2 hours in the afternoon, and 2 hours in the evening. Place foam block,  curve side up under heel at all times except when in CPM or when walking.  DO NOT modify, tear, cut, or change the foam block in any way.  MAKE SURE YOU:  Understand these instructions.  Get help right away if you are not doing well or get worse.    Thank you for letting us be a part of your medical care team.  It is a privilege we respect greatly.  We hope these instructions will help you stay on track for a fast and full recovery!   Increase activity slowly as tolerated   Complete by:  As directed       DISCHARGE MEDICATIONS:   Allergies as of 12/11/2017   No Known Allergies     Medication List    TAKE these medications   aspirin 325 MG EC tablet Take 1 tablet (325 mg total) by mouth 2 (two) times daily.   Biotin 5000 MCG Tabs Take 1 tablet by mouth daily.   calcium-vitamin D 500-200 MG-UNIT tablet Commonly known as:  OSCAL WITH D Take 1 tablet by mouth daily.   ferrous sulfate 325 (65 FE) MG tablet Take 325 mg by mouth daily with breakfast.   Fish Oil 1000 MG Caps Take 1 capsule by mouth daily.   gabapentin 300 MG capsule Commonly known as:  NEURONTIN Take 300 mg by mouth 3 (three) times daily.   levothyroxine 88 MCG tablet Commonly known as:   SYNTHROID, LEVOTHROID Take 88 mcg by mouth daily before breakfast.   methocarbamol 500 MG tablet Commonly known as:  ROBAXIN Take 1-2 tablets (500-1,000 mg total) by mouth every 6 (six) hours as needed for muscle spasms.   oxyCODONE 5 MG immediate release tablet Commonly known as:  Oxy IR/ROXICODONE Take 1-2 tablets (5-10 mg total) by mouth every 6 (six) hours as needed for moderate pain (pain score 4-6).   triamterene-hydrochlorothiazide 37.5-25 MG capsule Commonly known as:  DYAZIDE Take 1 capsule by mouth daily.   trolamine salicylate 10 % cream Commonly known as:  ASPERCREME Apply 1 application topically 2 (two) times daily as needed for muscle pain.   vitamin B-12 1000 MCG tablet Commonly known as:  CYANOCOBALAMIN Take 1,000 mcg by mouth daily.            Durable Medical Equipment  (From admission, onward)         Start     Ordered   12/09/17 1148  DME Walker rolling  Once    Question:  Patient needs a walker to treat with the following condition  Answer:  S/P total knee replacement   12/09/17 1147   12/09/17 1148  DME 3 n 1  Once     12/09/17 1147   12/09/17 1148  DME Bedside commode  Once    Question:  Patient needs a bedside commode to treat with the following condition  Answer:  S/P total knee replacement   12/09/17 1147          FOLLOW UP VISIT:    DISPOSITION: HOME VS. SNF  CONDITION:  Good   Donia Ast 12/11/2017, 9:04 AM

## 2017-12-13 DIAGNOSIS — Z471 Aftercare following joint replacement surgery: Secondary | ICD-10-CM | POA: Diagnosis not present

## 2017-12-13 DIAGNOSIS — R262 Difficulty in walking, not elsewhere classified: Secondary | ICD-10-CM | POA: Diagnosis not present

## 2017-12-13 DIAGNOSIS — G8918 Other acute postprocedural pain: Secondary | ICD-10-CM | POA: Diagnosis not present

## 2017-12-13 DIAGNOSIS — Z4889 Encounter for other specified surgical aftercare: Secondary | ICD-10-CM | POA: Diagnosis not present

## 2017-12-13 DIAGNOSIS — D649 Anemia, unspecified: Secondary | ICD-10-CM | POA: Diagnosis not present

## 2017-12-19 DIAGNOSIS — Z96651 Presence of right artificial knee joint: Secondary | ICD-10-CM | POA: Diagnosis not present

## 2017-12-24 ENCOUNTER — Other Ambulatory Visit: Payer: Self-pay

## 2017-12-24 DIAGNOSIS — Z471 Aftercare following joint replacement surgery: Secondary | ICD-10-CM | POA: Diagnosis not present

## 2017-12-24 DIAGNOSIS — M62551 Muscle wasting and atrophy, not elsewhere classified, right thigh: Secondary | ICD-10-CM | POA: Diagnosis not present

## 2017-12-24 DIAGNOSIS — M25561 Pain in right knee: Secondary | ICD-10-CM | POA: Diagnosis not present

## 2017-12-24 DIAGNOSIS — R2689 Other abnormalities of gait and mobility: Secondary | ICD-10-CM | POA: Diagnosis not present

## 2017-12-24 DIAGNOSIS — M25461 Effusion, right knee: Secondary | ICD-10-CM | POA: Diagnosis not present

## 2017-12-24 DIAGNOSIS — Z96651 Presence of right artificial knee joint: Secondary | ICD-10-CM | POA: Diagnosis not present

## 2017-12-24 NOTE — Patient Outreach (Signed)
[facility]) Care Management  12/24/2017  Patricia Mason 1930/08/25 [id number]     Transition of Care Referral  Referral Date: 12/24/17 Referral Source: [facility] Discharge Report Date of Admission: 12/11/17 Diagnosis: right total knee arthroplasty Date of Discharge: 12/20/17 Facility: [place]: [facility]TA    Outreach attempt # 1 to patient. No answer at present. RN CM left HIPAA compliant voicemail message along with contact info.     Plan: RN CM will make outreach attempt to patient within 3-4 business days. RN CM will send unsuccessful outreach letter to patient.   Enzo Montgomery, RN,BSN,CCM [place] Management Telephonic Care Management Coordinator Direct Phone: [phone] Toll Free: [phone] Fax: [phone]

## 2017-12-24 NOTE — Patient Outreach (Signed)
[facility]) Care Management  12/24/2017  Patricia Mason Jan 27, 1931 [id number]    Transition of Care Referral  Referral Date: 12/24/17 Referral Source: [facility] Discharge Report Date of Admission: 12/11/17 Diagnosis: right total knee arthroplasty Date of Discharge: 12/20/17 Facility: [place]: [facility]TA   Incoming call from patient returning RN CM call. Spoke with patient who voices things are going well for her. She states that she is just returning home from attending her outpatient PT session. She reports that she is making great improvement and pleased with her progress. She saw MD for follow up already and goes back in four weeks. Her son is very supportive and involved in her care and takes her to appts. She voices that she has all her meds and no issues or concerns regarding them. She did not wish to complete med review as she was just getting back home and wanted to rest from therapy session. She denies any RN CM needs or concerns at this time. She was appreciative of follow up call. Patient advised that [place] will be mailed out to her via mail and to feel free to call for any needs or concerns in the future. She voiced understanding and was appreciative of call.    Plan: RN CM will close case at this time.   Enzo Montgomery, RN,BSN,CCM [place] [facility] Telephonic Care Management Coordinator Direct Phone: [phone] Toll Free: [phone] Fax: [phone]

## 2017-12-26 DIAGNOSIS — R2689 Other abnormalities of gait and mobility: Secondary | ICD-10-CM | POA: Diagnosis not present

## 2017-12-26 DIAGNOSIS — M25561 Pain in right knee: Secondary | ICD-10-CM | POA: Diagnosis not present

## 2017-12-26 DIAGNOSIS — M62551 Muscle wasting and atrophy, not elsewhere classified, right thigh: Secondary | ICD-10-CM | POA: Diagnosis not present

## 2017-12-26 DIAGNOSIS — Z96651 Presence of right artificial knee joint: Secondary | ICD-10-CM | POA: Diagnosis not present

## 2017-12-26 DIAGNOSIS — M25461 Effusion, right knee: Secondary | ICD-10-CM | POA: Diagnosis not present

## 2017-12-31 DIAGNOSIS — M62551 Muscle wasting and atrophy, not elsewhere classified, right thigh: Secondary | ICD-10-CM | POA: Diagnosis not present

## 2017-12-31 DIAGNOSIS — Z96651 Presence of right artificial knee joint: Secondary | ICD-10-CM | POA: Diagnosis not present

## 2017-12-31 DIAGNOSIS — R2689 Other abnormalities of gait and mobility: Secondary | ICD-10-CM | POA: Diagnosis not present

## 2017-12-31 DIAGNOSIS — M25561 Pain in right knee: Secondary | ICD-10-CM | POA: Diagnosis not present

## 2017-12-31 DIAGNOSIS — M25461 Effusion, right knee: Secondary | ICD-10-CM | POA: Diagnosis not present

## 2018-01-03 DIAGNOSIS — M62551 Muscle wasting and atrophy, not elsewhere classified, right thigh: Secondary | ICD-10-CM | POA: Diagnosis not present

## 2018-01-03 DIAGNOSIS — M25461 Effusion, right knee: Secondary | ICD-10-CM | POA: Diagnosis not present

## 2018-01-03 DIAGNOSIS — M25561 Pain in right knee: Secondary | ICD-10-CM | POA: Diagnosis not present

## 2018-01-03 DIAGNOSIS — Z96651 Presence of right artificial knee joint: Secondary | ICD-10-CM | POA: Diagnosis not present

## 2018-01-03 DIAGNOSIS — R2689 Other abnormalities of gait and mobility: Secondary | ICD-10-CM | POA: Diagnosis not present

## 2018-01-07 DIAGNOSIS — M25461 Effusion, right knee: Secondary | ICD-10-CM | POA: Diagnosis not present

## 2018-01-07 DIAGNOSIS — M25561 Pain in right knee: Secondary | ICD-10-CM | POA: Diagnosis not present

## 2018-01-07 DIAGNOSIS — R2689 Other abnormalities of gait and mobility: Secondary | ICD-10-CM | POA: Diagnosis not present

## 2018-01-07 DIAGNOSIS — Z96651 Presence of right artificial knee joint: Secondary | ICD-10-CM | POA: Diagnosis not present

## 2018-01-07 DIAGNOSIS — M62551 Muscle wasting and atrophy, not elsewhere classified, right thigh: Secondary | ICD-10-CM | POA: Diagnosis not present

## 2018-01-10 DIAGNOSIS — R2689 Other abnormalities of gait and mobility: Secondary | ICD-10-CM | POA: Diagnosis not present

## 2018-01-10 DIAGNOSIS — M25461 Effusion, right knee: Secondary | ICD-10-CM | POA: Diagnosis not present

## 2018-01-10 DIAGNOSIS — M62551 Muscle wasting and atrophy, not elsewhere classified, right thigh: Secondary | ICD-10-CM | POA: Diagnosis not present

## 2018-01-10 DIAGNOSIS — M25561 Pain in right knee: Secondary | ICD-10-CM | POA: Diagnosis not present

## 2018-01-10 DIAGNOSIS — Z96651 Presence of right artificial knee joint: Secondary | ICD-10-CM | POA: Diagnosis not present

## 2018-01-14 DIAGNOSIS — M25561 Pain in right knee: Secondary | ICD-10-CM | POA: Diagnosis not present

## 2018-01-14 DIAGNOSIS — R2689 Other abnormalities of gait and mobility: Secondary | ICD-10-CM | POA: Diagnosis not present

## 2018-01-14 DIAGNOSIS — M62551 Muscle wasting and atrophy, not elsewhere classified, right thigh: Secondary | ICD-10-CM | POA: Diagnosis not present

## 2018-01-14 DIAGNOSIS — M25461 Effusion, right knee: Secondary | ICD-10-CM | POA: Diagnosis not present

## 2018-01-14 DIAGNOSIS — Z96651 Presence of right artificial knee joint: Secondary | ICD-10-CM | POA: Diagnosis not present

## 2018-01-17 DIAGNOSIS — M25561 Pain in right knee: Secondary | ICD-10-CM | POA: Diagnosis not present

## 2018-01-17 DIAGNOSIS — M25461 Effusion, right knee: Secondary | ICD-10-CM | POA: Diagnosis not present

## 2018-01-17 DIAGNOSIS — R2689 Other abnormalities of gait and mobility: Secondary | ICD-10-CM | POA: Diagnosis not present

## 2018-01-17 DIAGNOSIS — Z96651 Presence of right artificial knee joint: Secondary | ICD-10-CM | POA: Diagnosis not present

## 2018-01-17 DIAGNOSIS — M62551 Muscle wasting and atrophy, not elsewhere classified, right thigh: Secondary | ICD-10-CM | POA: Diagnosis not present

## 2018-01-22 DIAGNOSIS — M25561 Pain in right knee: Secondary | ICD-10-CM | POA: Diagnosis not present

## 2018-01-22 DIAGNOSIS — Z96651 Presence of right artificial knee joint: Secondary | ICD-10-CM | POA: Diagnosis not present

## 2018-01-22 DIAGNOSIS — M62551 Muscle wasting and atrophy, not elsewhere classified, right thigh: Secondary | ICD-10-CM | POA: Diagnosis not present

## 2018-01-22 DIAGNOSIS — M25461 Effusion, right knee: Secondary | ICD-10-CM | POA: Diagnosis not present

## 2018-01-22 DIAGNOSIS — R2689 Other abnormalities of gait and mobility: Secondary | ICD-10-CM | POA: Diagnosis not present

## 2018-01-24 DIAGNOSIS — M25561 Pain in right knee: Secondary | ICD-10-CM | POA: Diagnosis not present

## 2018-01-24 DIAGNOSIS — R2689 Other abnormalities of gait and mobility: Secondary | ICD-10-CM | POA: Diagnosis not present

## 2018-01-24 DIAGNOSIS — M25461 Effusion, right knee: Secondary | ICD-10-CM | POA: Diagnosis not present

## 2018-01-24 DIAGNOSIS — M62551 Muscle wasting and atrophy, not elsewhere classified, right thigh: Secondary | ICD-10-CM | POA: Diagnosis not present

## 2018-01-24 DIAGNOSIS — Z96651 Presence of right artificial knee joint: Secondary | ICD-10-CM | POA: Diagnosis not present

## 2018-01-28 DIAGNOSIS — M25561 Pain in right knee: Secondary | ICD-10-CM | POA: Diagnosis not present

## 2018-01-28 DIAGNOSIS — Z96651 Presence of right artificial knee joint: Secondary | ICD-10-CM | POA: Diagnosis not present

## 2018-01-28 DIAGNOSIS — R2689 Other abnormalities of gait and mobility: Secondary | ICD-10-CM | POA: Diagnosis not present

## 2018-01-28 DIAGNOSIS — M62551 Muscle wasting and atrophy, not elsewhere classified, right thigh: Secondary | ICD-10-CM | POA: Diagnosis not present

## 2018-01-28 DIAGNOSIS — M25461 Effusion, right knee: Secondary | ICD-10-CM | POA: Diagnosis not present

## 2018-01-31 DIAGNOSIS — M25461 Effusion, right knee: Secondary | ICD-10-CM | POA: Diagnosis not present

## 2018-01-31 DIAGNOSIS — R2689 Other abnormalities of gait and mobility: Secondary | ICD-10-CM | POA: Diagnosis not present

## 2018-01-31 DIAGNOSIS — Z96651 Presence of right artificial knee joint: Secondary | ICD-10-CM | POA: Diagnosis not present

## 2018-01-31 DIAGNOSIS — M62551 Muscle wasting and atrophy, not elsewhere classified, right thigh: Secondary | ICD-10-CM | POA: Diagnosis not present

## 2018-01-31 DIAGNOSIS — M25561 Pain in right knee: Secondary | ICD-10-CM | POA: Diagnosis not present

## 2018-02-05 DIAGNOSIS — Z96651 Presence of right artificial knee joint: Secondary | ICD-10-CM | POA: Diagnosis not present

## 2018-02-05 DIAGNOSIS — M62551 Muscle wasting and atrophy, not elsewhere classified, right thigh: Secondary | ICD-10-CM | POA: Diagnosis not present

## 2018-02-05 DIAGNOSIS — R2689 Other abnormalities of gait and mobility: Secondary | ICD-10-CM | POA: Diagnosis not present

## 2018-02-05 DIAGNOSIS — M25561 Pain in right knee: Secondary | ICD-10-CM | POA: Diagnosis not present

## 2018-02-05 DIAGNOSIS — M25461 Effusion, right knee: Secondary | ICD-10-CM | POA: Diagnosis not present

## 2018-02-06 DIAGNOSIS — Z23 Encounter for immunization: Secondary | ICD-10-CM | POA: Diagnosis not present

## 2018-02-06 DIAGNOSIS — Z6826 Body mass index (BMI) 26.0-26.9, adult: Secondary | ICD-10-CM | POA: Diagnosis not present

## 2018-02-06 DIAGNOSIS — M25473 Effusion, unspecified ankle: Secondary | ICD-10-CM | POA: Diagnosis not present

## 2018-02-06 DIAGNOSIS — E663 Overweight: Secondary | ICD-10-CM | POA: Diagnosis not present

## 2018-02-06 DIAGNOSIS — Z1339 Encounter for screening examination for other mental health and behavioral disorders: Secondary | ICD-10-CM | POA: Diagnosis not present

## 2018-02-06 DIAGNOSIS — I10 Essential (primary) hypertension: Secondary | ICD-10-CM | POA: Diagnosis not present

## 2018-02-06 DIAGNOSIS — E039 Hypothyroidism, unspecified: Secondary | ICD-10-CM | POA: Diagnosis not present

## 2018-02-06 DIAGNOSIS — Z0000 Encounter for general adult medical examination without abnormal findings: Secondary | ICD-10-CM | POA: Diagnosis not present

## 2018-02-06 DIAGNOSIS — Z79899 Other long term (current) drug therapy: Secondary | ICD-10-CM | POA: Diagnosis not present

## 2018-02-06 DIAGNOSIS — Z1331 Encounter for screening for depression: Secondary | ICD-10-CM | POA: Diagnosis not present

## 2018-02-06 DIAGNOSIS — G3184 Mild cognitive impairment, so stated: Secondary | ICD-10-CM | POA: Diagnosis not present

## 2018-03-18 DIAGNOSIS — C44311 Basal cell carcinoma of skin of nose: Secondary | ICD-10-CM | POA: Diagnosis not present

## 2018-03-18 DIAGNOSIS — L570 Actinic keratosis: Secondary | ICD-10-CM | POA: Diagnosis not present

## 2018-03-24 DIAGNOSIS — Z6826 Body mass index (BMI) 26.0-26.9, adult: Secondary | ICD-10-CM | POA: Diagnosis not present

## 2018-03-24 DIAGNOSIS — G3184 Mild cognitive impairment, so stated: Secondary | ICD-10-CM | POA: Diagnosis not present

## 2018-04-08 DIAGNOSIS — M1712 Unilateral primary osteoarthritis, left knee: Secondary | ICD-10-CM | POA: Diagnosis not present

## 2018-04-08 DIAGNOSIS — G8929 Other chronic pain: Secondary | ICD-10-CM | POA: Diagnosis not present

## 2018-04-29 DIAGNOSIS — G629 Polyneuropathy, unspecified: Secondary | ICD-10-CM | POA: Diagnosis not present

## 2018-04-29 DIAGNOSIS — Z6826 Body mass index (BMI) 26.0-26.9, adult: Secondary | ICD-10-CM | POA: Diagnosis not present

## 2018-05-15 DIAGNOSIS — C44311 Basal cell carcinoma of skin of nose: Secondary | ICD-10-CM | POA: Diagnosis not present

## 2018-05-19 ENCOUNTER — Other Ambulatory Visit: Payer: Self-pay | Admitting: Orthopedic Surgery

## 2018-06-10 ENCOUNTER — Ambulatory Visit: Payer: PPO | Admitting: Neurology

## 2018-07-01 DIAGNOSIS — Z0000 Encounter for general adult medical examination without abnormal findings: Secondary | ICD-10-CM | POA: Diagnosis not present

## 2018-07-01 DIAGNOSIS — Z1331 Encounter for screening for depression: Secondary | ICD-10-CM | POA: Diagnosis not present

## 2018-07-01 DIAGNOSIS — Z1339 Encounter for screening examination for other mental health and behavioral disorders: Secondary | ICD-10-CM | POA: Diagnosis not present

## 2018-07-24 ENCOUNTER — Other Ambulatory Visit: Payer: Self-pay | Admitting: Orthopedic Surgery

## 2018-08-04 ENCOUNTER — Ambulatory Visit (HOSPITAL_COMMUNITY): Admission: RE | Admit: 2018-08-04 | Payer: PPO | Source: Home / Self Care | Admitting: Orthopedic Surgery

## 2018-08-04 ENCOUNTER — Encounter (HOSPITAL_COMMUNITY): Admission: RE | Payer: Self-pay | Source: Home / Self Care

## 2018-08-04 SURGERY — ARTHROPLASTY, KNEE, TOTAL
Anesthesia: Spinal | Laterality: Left

## 2018-08-15 DIAGNOSIS — Z79899 Other long term (current) drug therapy: Secondary | ICD-10-CM | POA: Diagnosis not present

## 2018-08-15 DIAGNOSIS — Z6826 Body mass index (BMI) 26.0-26.9, adult: Secondary | ICD-10-CM | POA: Diagnosis not present

## 2018-08-15 DIAGNOSIS — R8290 Unspecified abnormal findings in urine: Secondary | ICD-10-CM | POA: Diagnosis not present

## 2018-08-15 DIAGNOSIS — G629 Polyneuropathy, unspecified: Secondary | ICD-10-CM | POA: Diagnosis not present

## 2018-08-15 DIAGNOSIS — E039 Hypothyroidism, unspecified: Secondary | ICD-10-CM | POA: Diagnosis not present

## 2018-08-15 DIAGNOSIS — I10 Essential (primary) hypertension: Secondary | ICD-10-CM | POA: Diagnosis not present

## 2018-08-18 ENCOUNTER — Ambulatory Visit: Admit: 2018-08-18 | Payer: PPO | Admitting: Orthopedic Surgery

## 2018-08-18 SURGERY — ARTHROPLASTY, KNEE, TOTAL
Anesthesia: Spinal | Laterality: Left

## 2018-09-01 DIAGNOSIS — N390 Urinary tract infection, site not specified: Secondary | ICD-10-CM | POA: Diagnosis not present

## 2018-09-01 DIAGNOSIS — Z6826 Body mass index (BMI) 26.0-26.9, adult: Secondary | ICD-10-CM | POA: Diagnosis not present

## 2018-09-10 ENCOUNTER — Other Ambulatory Visit: Payer: Self-pay | Admitting: Orthopedic Surgery

## 2018-09-15 ENCOUNTER — Encounter: Payer: Self-pay | Admitting: Neurology

## 2018-09-15 ENCOUNTER — Ambulatory Visit (INDEPENDENT_AMBULATORY_CARE_PROVIDER_SITE_OTHER): Payer: PPO | Admitting: Neurology

## 2018-09-15 ENCOUNTER — Other Ambulatory Visit: Payer: Self-pay

## 2018-09-15 ENCOUNTER — Telehealth: Payer: Self-pay | Admitting: Neurology

## 2018-09-15 ENCOUNTER — Other Ambulatory Visit (HOSPITAL_COMMUNITY): Payer: Self-pay | Admitting: Neurology

## 2018-09-15 DIAGNOSIS — G629 Polyneuropathy, unspecified: Secondary | ICD-10-CM

## 2018-09-15 DIAGNOSIS — R202 Paresthesia of skin: Secondary | ICD-10-CM

## 2018-09-15 MED ORDER — TOPIRAMATE 25 MG PO TABS: 25.0000 mg | ORAL_TABLET | Freq: Two times a day (BID) | ORAL | 2 refills | Status: DC

## 2018-09-15 MED ORDER — TOPIRAMATE 25 MG PO TABS: 25.0000 mg | ORAL_TABLET | Freq: Every day | ORAL | 2 refills | Status: DC

## 2018-09-15 NOTE — Patient Instructions (Addendum)
I had a long discussion with the patient and her son regarding her longstanding chronic sensory neuropathy and recent flareup and symptoms of paresthesias and answered questions.  I recommend checking neuropathy panel labs, EMG nerve conduction study and a trial of Topamax 25 mg twice daily to be added to her gabapentin 300 mg 3 times daily.  May consider tapering gabapentin in the future if Topamax is effective.  I have discussed possible side effects of Topamax with the patient and son and asked him to call me if needed.  We will also send for prior neurological records from Dr. Kary Kos and Dr. Applegate's office.  She will return for follow-up with me in 2 months or call earlier if necessary.  Neuropathic Pain Neuropathic pain is pain caused by damage to the nerves that are responsible for certain sensations in your body (sensory nerves). The pain can be caused by:  Damage to the sensory nerves that send signals to your spinal cord and brain (peripheral nervous system).  Damage to the sensory nerves in your brain or spinal cord (central nervous system). Neuropathic pain can make you more sensitive to pain. Even a minor sensation can feel very painful. This is usually a long-term condition that can be difficult to treat. The type of pain differs from person to person. It may:  Start suddenly (acute), or it may develop slowly and last for a long time (chronic).  Come and go as damaged nerves heal, or it may stay at the same level for years.  Cause emotional distress, loss of sleep, and a lower quality of life. What are the causes? The most common cause of this condition is diabetes. Many other diseases and conditions can also cause neuropathic pain. Causes of neuropathic pain can be classified as:  Toxic. This is caused by medicines and chemicals. The most common cause of toxic neuropathic pain is damage from cancer treatments (chemotherapy).  Metabolic. This can be caused by: ? Diabetes.  This is the most common disease that damages the nerves. ? Lack of vitamin B from long-term alcohol abuse.  Traumatic. Any injury that cuts, crushes, or stretches a nerve can cause damage and pain. A common example is feeling pain after losing an arm or leg (phantom limb pain).  Compression-related. If a sensory nerve gets trapped or compressed for a long period of time, the blood supply to the nerve can be cut off.  Vascular. Many blood vessel diseases can cause neuropathic pain by decreasing blood supply and oxygen to nerves.  Autoimmune. This type of pain results from diseases in which the body's defense system (immune system) mistakenly attacks sensory nerves. Examples of autoimmune diseases that can cause neuropathic pain include lupus and multiple sclerosis.  Infectious. Many types of viral infections can damage sensory nerves and cause pain. Shingles infection is a common cause of this type of pain.  Inherited. Neuropathic pain can be a symptom of many diseases that are passed down through families (genetic). What increases the risk? You are more likely to develop this condition if:  You have diabetes.  You smoke.  You drink too much alcohol.  You are taking certain medicines, including medicines that kill cancer cells (chemotherapy) or that treat immune system disorders. What are the signs or symptoms? The main symptom is pain. Neuropathic pain is often described as:  Burning.  Shock-like.  Stinging.  Hot or cold.  Itching. How is this diagnosed? No single test can diagnose neuropathic pain. It is diagnosed based  on:  Physical exam and your symptoms. Your health care provider will ask you about your pain. You may be asked to use a pain scale to describe how bad your pain is.  Tests. These may be done to see if you have a high sensitivity to pain and to help find the cause and location of any sensory nerve damage. They include: ? Nerve conduction studies to test how  well nerve signals travel through your sensory nerves (electrodiagnostic testing). ? Stimulating your sensory nerves through electrodes on your skin and measuring the response in your spinal cord and brain (somatosensory evoked potential).  Imaging studies, such as: ? X-rays. ? CT scan. ? MRI. How is this treated? Treatment for neuropathic pain may change over time. You may need to try different treatment options or a combination of treatments. Some options include:  Treating the underlying cause of the neuropathy, such as diabetes, kidney disease, or vitamin deficiencies.  Stopping medicines that can cause neuropathy, such as chemotherapy.  Medicine to relieve pain. Medicines may include: ? Prescription or over-the-counter pain medicine. ? Anti-seizure medicine. ? Antidepressant medicines. ? Pain-relieving patches that are applied to painful areas of skin. ? A medicine to numb the area (local anesthetic), which can be injected as a nerve block.  Transcutaneous nerve stimulation. This uses electrical currents to block painful nerve signals. The treatment is painless.  Alternative treatments, such as: ? Acupuncture. ? Meditation. ? Massage. ? Physical therapy. ? Pain management programs. ? Counseling. Follow these instructions at home: Medicines   Take over-the-counter and prescription medicines only as told by your health care provider.  Do not drive or use heavy machinery while taking prescription pain medicine.  If you are taking prescription pain medicine, take actions to prevent or treat constipation. Your health care provider may recommend that you: ? Drink enough fluid to keep your urine pale yellow. ? Eat foods that are high in fiber, such as fresh fruits and vegetables, whole grains, and beans. ? Limit foods that are high in fat and processed sugars, such as fried or sweet foods. ? Take an over-the-counter or prescription medicine for constipation. Lifestyle    Have a good support system at home.  Consider joining a chronic pain support group.  Do not use any products that contain nicotine or tobacco, such as cigarettes and e-cigarettes. If you need help quitting, ask your health care provider.  Do not drink alcohol. General instructions  Learn as much as you can about your condition.  Work closely with all your health care providers to find the treatment plan that works best for you.  Ask your health care provider what activities are safe for you.  Keep all follow-up visits as told by your health care provider. This is important. Contact a health care provider if:  Your pain treatments are not working.  You are having side effects from your medicines.  You are struggling with tiredness (fatigue), mood changes, depression, or anxiety. Summary  Neuropathic pain is pain caused by damage to the nerves that are responsible for certain sensations in your body (sensory nerves).  Neuropathic pain may come and go as damaged nerves heal, or it may stay at the same level for years.  Neuropathic pain is usually a long-term condition that can be difficult to treat. Consider joining a chronic pain support group. This information is not intended to replace advice given to you by your health care provider. Make sure you discuss any questions you have with your  health care provider. Document Released: 12/01/2003 Document Revised: 06/26/2018 Document Reviewed: 03/22/2017 Elsevier Patient Education  2020 [employer].

## 2018-09-15 NOTE — Progress Notes (Signed)
[facility] [address] [place]. [place] [address] [phone]       OFFICE CONSULT NOTE  [name] Date of Birth:  1930-09-22 Medical Record Number:  [id number]   Referring MD:  Haydee Salter, NP  Reason for Referral:  Sensory neuropathy  HPI: Patricia Mason is a 85 year old pleasant Caucasian lady seen today for initial office consultation visit for neuropathy.  History is obtained from the patient as well as referral notes and from her son was present for this visit.  She has a past medical history of hypertension, degenerative disc disease, hypothyroidism basal cell carcinoma of the nose.  States she has a 10-year history of sensory peripheral neuropathy.  She describes this as discomfort in her feet from the ankle down bilaterally.  Over the last 6 years she was being followed at Dr. Legrand Como Applegate's neurology clinic in [place] but for the last couple of years since he left this practice she has had no neurological follow-up.  She apparently had a EMG nerve conduction study done in September 2015 which showed absent sural sensory responses but I do not have that report to review.  She states in the last 3 months or so she is noticed increased and worsening particularly in her right leg.  She describes discomfort now present throughout the day but more noticeable when she is is on her feet or at night.  At times she has trouble sleeping.  She is currently taking Neurontin 300 mg 3 times daily which previously used to work well but there is no longer keeping her discomfort under control.  She also admits to balance being slightly off however she has had no major falls.  She can get by indoors without any help but does use a cane for outdoors.  She was recently seen by Dr. Dominica Severin cram neurosurgeon who apparently did some work-up for degenerative back disease but I do not have his records for my review today.  Similarly I have not seen Dr. Applegate's records for my review  today as well.  Patient also has some numbness in her hands but they are it is not as bad.  She denies significant weakness in the hands or diminished fine motor skills.  She does not trip or fall.  She has not tried other medications besides gabapentin for neuropathic pain.  She denies any recent loss of weight, decreased appetite, fever or chronic cough.  She denies history of diabetes or heavy alcohol intake ROS:   14 system review of systems is positive for burning, tingling, numbness, pain, leg discomfort, gait difficulty, balance difficulty, knee pain, arthritis and all other systems negative  PMH:  Past Medical History:  Diagnosis Date   Cancer ([place])    Skin-Squamous cell   Complication of anesthesia    Hypertension    Hyperthyroidism    PONV (postoperative nausea and vomiting)     Social History:  Social History   Socioeconomic History   Marital status: Widowed    Spouse name: Not on file   Number of children: Not on file   Years of education: Not on file   Highest education level: Not on file  Occupational History   Not on file  Social Needs   Financial resource strain: Not on file   Food insecurity    Worry: Not on file    Inability: Not on file   Transportation needs    Medical: Not on file    Non-medical: Not on file  Tobacco Use   Smoking status: Never Smoker   Smokeless tobacco: Never Used  Substance and Sexual Activity   Alcohol use: Never    Frequency: Never   Drug use: Never   Sexual activity: Not on file  Lifestyle   Physical activity    Days per week: Not on file    Minutes per session: Not on file   Stress: Not on file  Relationships   Social connections    Talks on phone: Not on file    Gets together: Not on file    Attends religious service: Not on file    Active member of club or organization: Not on file    Attends meetings of clubs or organizations: Not on file    Relationship status: Not on file   Intimate partner  violence    Fear of current or ex partner: Not on file    Emotionally abused: Not on file    Physically abused: Not on file    Forced sexual activity: Not on file  Other Topics Concern   Not on file  Social History Narrative   Not on file    Medications:   Current Outpatient Medications on File Prior to Visit  Medication Sig Dispense Refill   aspirin EC 325 MG EC tablet Take 1 tablet (325 mg total) by mouth 2 (two) times daily. 30 tablet 0   Biotin 5000 MCG TABS Take 1 tablet by mouth daily.     calcium-vitamin D (OSCAL WITH D) 500-200 MG-UNIT tablet Take 1 tablet by mouth daily.     ferrous sulfate 325 (65 FE) MG tablet Take 325 mg by mouth daily with breakfast.     gabapentin (NEURONTIN) 300 MG capsule Take 300 mg by mouth 3 (three) times daily.     levothyroxine (SYNTHROID, LEVOTHROID) 88 MCG tablet Take 88 mcg by mouth daily before breakfast.     Omega-3 Fatty Acids (FISH OIL) 1000 MG CAPS Take 1 capsule by mouth daily.     triamterene-hydrochlorothiazide (MAXZIDE-25) 37.5-25 MG tablet      trolamine salicylate (ASPERCREME) 10 % cream Apply 1 application topically 2 (two) times daily as needed for muscle pain.     vitamin B-12 (CYANOCOBALAMIN) 1000 MCG tablet Take 1,000 mcg by mouth daily.     No current facility-administered medications on file prior to visit.     Allergies:   Allergies  Allergen Reactions   Prednisone Hives   Sulfa Antibiotics     Physical Exam General: well developed, well nourished frail elderly Caucasian lady, seated, in no evident distress Head: head normocephalic and atraumatic.   Neck: supple with no carotid or supraclavicular bruits.  Surgical scar from prior thyroid surgery in the neck Cardiovascular: regular rate and rhythm, no murmurs Musculoskeletal: no deformity Skin:  no rash/petichiae Vascular:  Normal pulses all extremities  Neurologic Exam Mental Status: Awake and fully alert. Oriented to place and time. Recent and  remote memory intact. Attention span, concentration and fund of knowledge appropriate. Mood and affect appropriate.  Cranial Nerves: Fundoscopic exam reveals sharp disc margins. Pupils equal, briskly reactive to light. Extraocular movements full without nystagmus. Visual fields full to confrontation. Hearing intact. Facial sensation intact. Face, tongue, palate moves normally and symmetrically.  Motor: Normal bulk and tone. Normal strength in all tested extremity muscles. Sensory.: intact to touch , pinprick , position sensation but diminished vibratory sensation bilaterally from ankle down..  Romberg sign is negative Coordination: Rapid alternating movements normal in all extremities. Finger-to-nose  and heel-to-shin performed accurately bilaterally. Gait and Station: Arises from chair without difficulty. Stance is normal. Gait demonstrates normal stride length and balance .  She is unable to heel, toe and tandem walk  .  Reflexes: 1+ and symmetric except both ankle jerks are absent.. Toes downgoing.     ASSESSMENT: 74 lady with chronic sensory peripheral neuropathy with recent flareup and her lower extremity paresthesias of unclear etiology.    PLAN: I had a long discussion with the patient and her son regarding her longstanding chronic sensory neuropathy and recent flareup and symptoms of paresthesias and answered questions.  I recommend checking neuropathy panel labs, EMG nerve conduction study and a trial of Topamax 25 mg twice daily to be added to her gabapentin 300 mg 3 times daily.  May consider tapering gabapentin in the future if Topamax is effective.  I have discussed possible side effects of Topamax with the patient and son and asked him to call me if needed.  We will also send for prior neurological records from Dr. Kary Kos and Dr. Applegate's office.  Greater than 50% time during this 45-minute consultation visit was spent on counseling and coordination of care about her paresthesias  and sensory neuropathy and discussion about evaluation and treatment and answering questions.  She will return for follow-up with me in 2 months or call earlier if necessary. Antony Contras, MD  [facility] [address] [place], [place] [address]  Phone [phone] Fax [phone] Note: This document was prepared with digital dictation and possible smart phrase technology. Any transcriptional errors that result from this process are unintentional.

## 2018-09-15 NOTE — Telephone Encounter (Signed)
I called pt that directions have been revise according to Dr.Sethi note. PT wrote down 1 tablet 25mg  by mouth daily for 7 days, than increase to one tablet twice a day. Pt wrote down instructions and verbalized understanding.

## 2018-09-15 NOTE — Telephone Encounter (Signed)
Pt called stating that the provider told her how to take the topiramate (TOPAMAX) 25 MG tablet but now she picked it up and the instructions are different from what was told to her so the pt would like to be advised on the correct way to take her medication.

## 2018-09-15 NOTE — Telephone Encounter (Signed)
Per Dr.SEthi he wants pt to take       Sig: Take 1 tablet (25 mg total) by mouth 2 (two) times daily. Start 1 tablet daily x 7 days and then twice daily. I will call pt to give her revise instructions.

## 2018-09-16 LAB — NEUROPATHY PANEL: SED RATE: 9 mm/h (ref 0–40)

## 2018-09-17 LAB — NEUROPATHY PANEL
A/G Ratio: 1.7 (ref 0.7–1.7)
Albumin ELP: 4.2 g/dL (ref 2.9–4.4)
Alpha 1: 0.2 g/dL (ref 0.0–0.4)
Alpha 2: 0.7 g/dL (ref 0.4–1.0)
Angio Convert Enzyme: 31 U/L (ref 14–82)
Anti Nuclear Antibody (ANA): NEGATIVE
Beta: 0.8 g/dL (ref 0.7–1.3)
Gamma Globulin: 0.7 g/dL (ref 0.4–1.8)
Globulin, Total: 2.5 g/dL (ref 2.2–3.9)
Rheumatoid fact SerPl-aCnc: <10 IU/mL (ref 0.0–13.9)
Sed Rate: 9 mm/hr (ref 0–40)
TSH: 1.08 u[IU]/mL (ref 0.450–4.500)
Total Protein: 6.7 g/dL (ref 6.0–8.5)
Vit D, 25-Hydroxy: 37.8 ng/mL (ref 30.0–100.0)
Vitamin B-12: >2000 pg/mL — ABNORMAL HIGH (ref 232–1245)

## 2018-09-23 ENCOUNTER — Telehealth: Payer: Self-pay | Admitting: Neurology

## 2018-09-23 NOTE — Telephone Encounter (Addendum)
I called pt about her being dizzy with taking the one pill of topamax. Pt stated she was suppose to start taking one pill twice a day. She has stop taking it as of today. She does not want to fall and will just continue her gabapentin. Pt is having surgery on her knee on July 27. She will call back to r/s with Dr.SEthi once her surgery is done and she is gone with rehab.i stated message will be sent to [place].

## 2018-09-23 NOTE — Telephone Encounter (Signed)
Pt has called to inform she will not be able to stay on this medication, it makes her very dizzy.  Please call

## 2018-09-24 ENCOUNTER — Telehealth: Payer: Self-pay | Admitting: Neurology

## 2018-09-24 NOTE — Telephone Encounter (Signed)
Called the patient and advised the patient of the normal lab findings. Pt verbalized understanding and was appreciative for the call.

## 2018-09-24 NOTE — Telephone Encounter (Signed)
-----   Message from Garvin Fila, MD sent at 09/17/2018  3:57 PM EDT ----- Mitchell Heir inform the patient that lab work for reversible causes of peripheral neuropathy is all normal

## 2018-09-25 DIAGNOSIS — R509 Fever, unspecified: Secondary | ICD-10-CM | POA: Diagnosis not present

## 2018-09-25 DIAGNOSIS — Z20828 Contact with and (suspected) exposure to other viral communicable diseases: Secondary | ICD-10-CM | POA: Diagnosis not present

## 2018-09-25 DIAGNOSIS — Z03818 Encounter for observation for suspected exposure to other biological agents ruled out: Secondary | ICD-10-CM | POA: Diagnosis not present

## 2018-09-25 NOTE — Telephone Encounter (Signed)
ok 

## 2018-10-02 DIAGNOSIS — Z6826 Body mass index (BMI) 26.0-26.9, adult: Secondary | ICD-10-CM | POA: Diagnosis not present

## 2018-10-02 DIAGNOSIS — R0982 Postnasal drip: Secondary | ICD-10-CM | POA: Diagnosis not present

## 2018-10-09 ENCOUNTER — Other Ambulatory Visit (HOSPITAL_COMMUNITY): Payer: Self-pay | Admitting: *Deleted

## 2018-10-09 ENCOUNTER — Other Ambulatory Visit (HOSPITAL_COMMUNITY)
Admission: RE | Admit: 2018-10-09 | Discharge: 2018-10-09 | Disposition: A | Discharge status: Home / Self Care | Payer: PPO | Source: Ambulatory Visit | Attending: Orthopedic Surgery | Admitting: Orthopedic Surgery

## 2018-10-09 DIAGNOSIS — Z1159 Encounter for screening for other viral diseases: Secondary | ICD-10-CM | POA: Diagnosis not present

## 2018-10-09 LAB — SARS CORONAVIRUS 2 (TAT 6-24 HRS): SARS Coronavirus 2: NEGATIVE

## 2018-10-09 NOTE — Progress Notes (Signed)
EKG 12-04-17 Epic LOV DR SETHI NEURLOGY 09-15-18 EPIC

## 2018-10-09 NOTE — Patient Instructions (Addendum)
YOU  HAD  A COVID 19 TEST ON 10-09-2018. ONCE YOUR COVID TEST IS COMPLETED, PLEASE BEGIN THE QUARANTINE INSTRUCTIONS AS OUTLINED IN YOUR HANDOUT.                Patricia Mason    Your procedure is scheduled on: 10-13-2018  Report to [facility] Main  Entrance   Report to admitting at 655  AM    [address] [place].    Call this number if you have problems the morning of surgery [phone]    Remember: [place], NO CHEWING GUM CANDY OR MINTS.    NO SOLID FOOD AFTER MIDNIGHT THE NIGHT PRIOR TO SURGERY.    NOTHING BY MOUTH EXCEPT CLEAR LIQUIDS UNTIL  625 AM.     PLEASE FINISH ENSURE DRINK PER SURGEON ORDER 3 HOURS PRIOR TO SCHEDULED SURGERY TIME WHICH NEEDS TO BE COMPLETED AT 625 AM.   CLEAR LIQUID DIET   Foods Allowed                                                                     Foods Excluded  Coffee and tea, regular and decaf                             liquids that you cannot  Plain Jell-O any favor except red or purple                                           see through such as: Fruit ices (not with fruit pulp)                                     milk, soups, orange juice  Iced Popsicles                                    All solid food Carbonated beverages, regular and diet                                    Cranberry, grape and apple juices Sports drinks like Gatorade Lightly seasoned clear broth or consume(fat free) Sugar, honey syrup  Sample Menu Breakfast                                Lunch                                     Supper Cranberry juice                    Beef broth  Chicken broth Jell-O                                     Grape juice                           Apple juice Coffee or tea                        Jell-O                                      Popsicle                                                 Coffee or tea                        Coffee or tea  _____________________________________________________________________    Take these medicines the morning of surgery with A SIP OF WATER: GABAPENTIN, SYNTHROID                                You may not have any metal on your body including hair pins and              piercings  Do not wear jewelry, make-up, lotions, powders or perfumes, deodorant             Do not wear nail polish.  Do not shave  48 hours prior to surgery.           Do not bring valuables to the hospital. [place].  Contacts, dentures or bridgework may not be worn into surgery.  Leave suitcase in the car. After surgery it may be brought to your room.     _____________________________________________________________________             [facility] - Preparing for Surgery Before surgery, you can play an important role.  Because skin is not sterile, your skin needs to be as free of germs as possible.  You can reduce the number of germs on your skin by washing with CHG (chlorahexidine gluconate) soap before surgery.  CHG is an antiseptic cleaner which kills germs and bonds with the skin to continue killing germs even after washing. Please DO NOT use if you have an allergy to CHG or antibacterial soaps.  If your skin becomes reddened/irritated stop using the CHG and inform your nurse when you arrive at Short Stay. Do not shave (including legs and underarms) for at least 48 hours prior to the first CHG shower.  You may shave your face/neck. Please follow these instructions carefully:  1.  Shower with CHG Soap the night before surgery and the  morning of Surgery.  2.  If you choose to wash your hair, wash your hair first as usual with your  normal  shampoo.  3.  After you shampoo, rinse your hair and body thoroughly to remove the  shampoo.  4.  Use CHG as you would any other liquid soap.  You can  apply chg directly  to the skin and wash                       Gently with a scrungie or clean washcloth.  5.  Apply the CHG Soap to your body ONLY FROM THE NECK DOWN.   Do not use on face/ open                           Wound or open sores. Avoid contact with eyes, ears mouth and genitals (private parts).                       Wash face,  Genitals (private parts) with your normal soap.             6.  Wash thoroughly, paying special attention to the area where your surgery  will be performed.  7.  Thoroughly rinse your body with warm water from the neck down.  8.  DO NOT shower/wash with your normal soap after using and rinsing off  the CHG Soap.                9.  Pat yourself dry with a clean towel.            10.  Wear clean pajamas.            11.  Place clean sheets on your bed the night of your first shower and do not  sleep with pets. Day of Surgery : Do not apply any lotions/deodorants the morning of surgery.  Please wear clean clothes to the hospital/surgery center.  FAILURE TO FOLLOW THESE INSTRUCTIONS MAY RESULT IN THE CANCELLATION OF YOUR SURGERY PATIENT SIGNATURE_________________________________  NURSE SIGNATURE__________________________________  ________________________________________________________________________   [name]  An incentive spirometer is a tool that can help keep your lungs clear and active. This tool measures how well you are filling your lungs with each breath. Taking long deep breaths may help reverse or decrease the chance of developing breathing (pulmonary) problems (especially infection) following:  A long period of time when you are unable to move or be active. BEFORE THE PROCEDURE   If the spirometer includes an indicator to show your best effort, your nurse or respiratory therapist will set it to a desired goal.  If possible, sit up straight or lean slightly forward. Try not to slouch.  Hold the incentive spirometer in an upright  position. INSTRUCTIONS FOR USE  1. Sit on the edge of your bed if possible, or sit up as far as you can in bed or on a chair. 2. Hold the incentive spirometer in an upright position. 3. Breathe out normally. 4. Place the mouthpiece in your mouth and seal your lips tightly around it. 5. Breathe in slowly and as deeply as possible, raising the piston or the ball toward the top of the column. 6. Hold your breath for 3-5 seconds or for as long as possible. Allow the piston or ball to fall to the bottom of the column. 7. Remove the mouthpiece from your mouth and breathe out normally. 8. Rest for a few seconds and repeat Steps 1 through 7 at least 10 times every 1-2 hours when you are awake. Take your time and take a few normal breaths between deep breaths. 9. The spirometer may include an indicator to  show your best effort. Use the indicator as a goal to work toward during each repetition. 10. After each set of 10 deep breaths, practice coughing to be sure your lungs are clear. If you have an incision (the cut made at the time of surgery), support your incision when coughing by placing a pillow or rolled up towels firmly against it. Once you are able to get out of bed, walk around indoors and cough well. You may stop using the incentive spirometer when instructed by your caregiver.  RISKS AND COMPLICATIONS  Take your time so you do not get dizzy or light-headed.  If you are in pain, you may need to take or ask for pain medication before doing incentive spirometry. It is harder to take a deep breath if you are having pain. AFTER USE  Rest and breathe slowly and easily.  It can be helpful to keep track of a log of your progress. Your caregiver can provide you with a simple table to help with this. If you are using the spirometer at home, follow these instructions: [place] IF:   You are having difficultly using the spirometer.  You have trouble using the spirometer as often as  instructed.  Your pain medication is not giving enough relief while using the spirometer.  You develop fever of 100.5 F (38.1 C) or higher. SEEK IMMEDIATE MEDICAL CARE IF:   You cough up bloody sputum that had not been present before.  You develop fever of 102 F (38.9 C) or greater.  You develop worsening pain at or near the incision site. MAKE SURE YOU:   Understand these instructions.  Will watch your condition.  Will get help right away if you are not doing well or get worse. Document Released: 07/16/2006 Document Revised: 05/28/2011 Document Reviewed: 09/16/2006 [facility] Patient Information 2014 [place], [place].   ________________________________________________________________________

## 2018-10-10 ENCOUNTER — Other Ambulatory Visit: Payer: Self-pay

## 2018-10-10 ENCOUNTER — Encounter (HOSPITAL_COMMUNITY): Payer: Self-pay

## 2018-10-10 ENCOUNTER — Encounter (HOSPITAL_COMMUNITY)
Admission: RE | Admit: 2018-10-10 | Discharge: 2018-10-10 | Disposition: A | Discharge status: Home / Self Care | Payer: PPO | Source: Ambulatory Visit | Attending: Orthopedic Surgery | Admitting: Orthopedic Surgery

## 2018-10-10 DIAGNOSIS — M1712 Unilateral primary osteoarthritis, left knee: Secondary | ICD-10-CM | POA: Insufficient documentation

## 2018-10-10 DIAGNOSIS — Z85828 Personal history of other malignant neoplasm of skin: Secondary | ICD-10-CM | POA: Diagnosis not present

## 2018-10-10 DIAGNOSIS — Z888 Allergy status to other drugs, medicaments and biological substances status: Secondary | ICD-10-CM | POA: Diagnosis not present

## 2018-10-10 DIAGNOSIS — D509 Iron deficiency anemia, unspecified: Secondary | ICD-10-CM | POA: Diagnosis not present

## 2018-10-10 DIAGNOSIS — Z79899 Other long term (current) drug therapy: Secondary | ICD-10-CM | POA: Diagnosis not present

## 2018-10-10 DIAGNOSIS — E039 Hypothyroidism, unspecified: Secondary | ICD-10-CM | POA: Diagnosis not present

## 2018-10-10 DIAGNOSIS — Z01812 Encounter for preprocedural laboratory examination: Secondary | ICD-10-CM | POA: Insufficient documentation

## 2018-10-10 DIAGNOSIS — Z96651 Presence of right artificial knee joint: Secondary | ICD-10-CM | POA: Diagnosis not present

## 2018-10-10 DIAGNOSIS — Z90710 Acquired absence of both cervix and uterus: Secondary | ICD-10-CM | POA: Diagnosis not present

## 2018-10-10 DIAGNOSIS — Z20828 Contact with and (suspected) exposure to other viral communicable diseases: Secondary | ICD-10-CM | POA: Diagnosis not present

## 2018-10-10 DIAGNOSIS — G629 Polyneuropathy, unspecified: Secondary | ICD-10-CM | POA: Diagnosis not present

## 2018-10-10 DIAGNOSIS — Z882 Allergy status to sulfonamides status: Secondary | ICD-10-CM | POA: Diagnosis not present

## 2018-10-10 DIAGNOSIS — Z7982 Long term (current) use of aspirin: Secondary | ICD-10-CM | POA: Diagnosis not present

## 2018-10-10 DIAGNOSIS — I10 Essential (primary) hypertension: Secondary | ICD-10-CM | POA: Diagnosis not present

## 2018-10-10 HISTORY — DX: Hypothyroidism, unspecified: E03.9

## 2018-10-10 HISTORY — DX: Polyneuropathy, unspecified: G62.9

## 2018-10-10 HISTORY — DX: Deficiency of other specified B group vitamins: E53.8

## 2018-10-10 HISTORY — DX: Unspecified osteoarthritis, unspecified site: M19.90

## 2018-10-10 HISTORY — DX: Iron deficiency anemia, unspecified: D50.9

## 2018-10-10 LAB — COMPREHENSIVE METABOLIC PANEL
ALT: 12 U/L (ref 0–44)
AST: 17 U/L (ref 15–41)
Albumin: 4 g/dL (ref 3.5–5.0)
Alkaline Phosphatase: 52 U/L (ref 38–126)
Anion gap: 9 (ref 5–15)
BUN: 14 mg/dL (ref 8–23)
CO2: 29 mmol/L (ref 22–32)
Calcium: 8.7 mg/dL — ABNORMAL LOW (ref 8.9–10.3)
Chloride: 103 mmol/L (ref 98–111)
Creatinine, Ser: 0.82 mg/dL (ref 0.44–1.00)
GFR calc Af Amer: >60 mL/min (ref >60)
GFR calc non Af Amer: >60 mL/min (ref >60)
Glucose, Bld: 108 mg/dL — ABNORMAL HIGH (ref 70–99)
Potassium: 4.1 mmol/L (ref 3.5–5.1)
Sodium: 141 mmol/L (ref 135–145)
Total Bilirubin: 0.6 mg/dL (ref 0.3–1.2)
Total Protein: 6.9 g/dL (ref 6.5–8.1)

## 2018-10-10 LAB — CBC WITH DIFFERENTIAL/PLATELET
Abs Immature Granulocytes: 0.01 10*3/uL (ref 0.00–0.07)
Basophils Absolute: 0 10*3/uL (ref 0.0–0.1)
Basophils Relative: 0 %
Eosinophils Absolute: 0.1 10*3/uL (ref 0.0–0.5)
Eosinophils Relative: 1 %
HCT: 38.7 % (ref 36.0–46.0)
Hemoglobin: 12.2 g/dL (ref 12.0–15.0)
Immature Granulocytes: 0 %
Lymphocytes Relative: 31 %
Lymphs Abs: 1.8 10*3/uL (ref 0.7–4.0)
MCH: 31.4 pg (ref 26.0–34.0)
MCHC: 31.5 g/dL (ref 30.0–36.0)
MCV: 99.7 fL (ref 80.0–100.0)
Monocytes Absolute: 0.5 10*3/uL (ref 0.1–1.0)
Monocytes Relative: 8 %
Neutro Abs: 3.5 10*3/uL (ref 1.7–7.7)
Neutrophils Relative %: 60 %
Platelets: 226 10*3/uL (ref 150–400)
RBC: 3.88 MIL/uL (ref 3.87–5.11)
RDW: 13 % (ref 11.5–15.5)
WBC: 5.9 10*3/uL (ref 4.0–10.5)
nRBC: 0 % (ref 0.0–0.2)

## 2018-10-10 LAB — SURGICAL PCR SCREEN
MRSA, PCR: NEGATIVE
Staphylococcus aureus: NEGATIVE

## 2018-10-10 NOTE — Progress Notes (Signed)
SPOKE W/  Candace     SCREENING SYMPTOMS OF COVID 19:   COUGH--NO  RUNNY NOSE--- NO  SORE THROAT---NO  NASAL CONGESTION----NO  SNEEZING----NO  SHORTNESS OF BREATH---NO  DIFFICULTY BREATHING---NO  TEMP >100.0 -----NO  UNEXPLAINED BODY ACHES------NO  CHILLS -------- NO  HEADACHES ---------NO  LOSS OF SMELL/ TASTE --------NO    HAVE YOU OR ANY FAMILY MEMBER TRAVELLED PAST 14 DAYS OUT OF THE   COUNTY---LIVES IN [place] STATE----NO COUNTRY----NO  HAVE YOU OR ANY FAMILY MEMBER BEEN EXPOSED TO ANYONE WITH COVID 19? NO

## 2018-10-12 MED ORDER — BUPIVACAINE LIPOSOME 1.3 % IJ SUSP
20.0000 mL | INTRAMUSCULAR | Status: DC
  Filled 2018-10-12: qty 20

## 2018-10-13 ENCOUNTER — Ambulatory Visit (HOSPITAL_COMMUNITY)
Admission: RE | Admit: 2018-10-13 | Discharge: 2018-10-15 | Disposition: A | Discharge status: Skilled Nursing Facility | Payer: PPO | Attending: Orthopedic Surgery | Admitting: Orthopedic Surgery

## 2018-10-13 ENCOUNTER — Other Ambulatory Visit: Payer: Self-pay

## 2018-10-13 ENCOUNTER — Encounter (HOSPITAL_COMMUNITY): Payer: Self-pay | Admitting: *Deleted

## 2018-10-13 ENCOUNTER — Ambulatory Visit (HOSPITAL_COMMUNITY): Payer: PPO | Admitting: Physician Assistant

## 2018-10-13 ENCOUNTER — Encounter (HOSPITAL_COMMUNITY)
Admission: RE | Disposition: A | Discharge status: Skilled Nursing Facility | Payer: Self-pay | Source: Home / Self Care | Attending: Orthopedic Surgery

## 2018-10-13 ENCOUNTER — Ambulatory Visit (HOSPITAL_COMMUNITY): Payer: PPO | Admitting: Anesthesiology

## 2018-10-13 DIAGNOSIS — Z96651 Presence of right artificial knee joint: Secondary | ICD-10-CM | POA: Diagnosis not present

## 2018-10-13 DIAGNOSIS — Z20828 Contact with and (suspected) exposure to other viral communicable diseases: Secondary | ICD-10-CM | POA: Diagnosis not present

## 2018-10-13 DIAGNOSIS — G629 Polyneuropathy, unspecified: Secondary | ICD-10-CM | POA: Insufficient documentation

## 2018-10-13 DIAGNOSIS — Z85828 Personal history of other malignant neoplasm of skin: Secondary | ICD-10-CM | POA: Insufficient documentation

## 2018-10-13 DIAGNOSIS — E039 Hypothyroidism, unspecified: Secondary | ICD-10-CM | POA: Diagnosis not present

## 2018-10-13 DIAGNOSIS — M1712 Unilateral primary osteoarthritis, left knee: Secondary | ICD-10-CM | POA: Insufficient documentation

## 2018-10-13 DIAGNOSIS — Z79899 Other long term (current) drug therapy: Secondary | ICD-10-CM | POA: Insufficient documentation

## 2018-10-13 DIAGNOSIS — Z90710 Acquired absence of both cervix and uterus: Secondary | ICD-10-CM | POA: Diagnosis not present

## 2018-10-13 DIAGNOSIS — D509 Iron deficiency anemia, unspecified: Secondary | ICD-10-CM | POA: Insufficient documentation

## 2018-10-13 DIAGNOSIS — Z96659 Presence of unspecified artificial knee joint: Secondary | ICD-10-CM

## 2018-10-13 DIAGNOSIS — Z882 Allergy status to sulfonamides status: Secondary | ICD-10-CM | POA: Diagnosis not present

## 2018-10-13 DIAGNOSIS — G8918 Other acute postprocedural pain: Secondary | ICD-10-CM | POA: Diagnosis not present

## 2018-10-13 DIAGNOSIS — I10 Essential (primary) hypertension: Secondary | ICD-10-CM | POA: Insufficient documentation

## 2018-10-13 DIAGNOSIS — Z7982 Long term (current) use of aspirin: Secondary | ICD-10-CM | POA: Diagnosis not present

## 2018-10-13 DIAGNOSIS — Z888 Allergy status to other drugs, medicaments and biological substances status: Secondary | ICD-10-CM | POA: Insufficient documentation

## 2018-10-13 HISTORY — PX: TOTAL KNEE ARTHROPLASTY: SHX125

## 2018-10-13 SURGERY — ARTHROPLASTY, KNEE, TOTAL
Anesthesia: Monitor Anesthesia Care | Site: Knee | Laterality: Left

## 2018-10-13 MED ORDER — ASPIRIN EC 325 MG PO TBEC
325.0000 mg | DELAYED_RELEASE_TABLET | Freq: Two times a day (BID) | ORAL | Status: DC
  Administered 2018-10-14 – 2018-10-15 (×3): 325 mg via ORAL
  Filled 2018-10-13 (×4): qty 1

## 2018-10-13 MED ORDER — PROPOFOL 10 MG/ML IV BOLUS
INTRAVENOUS | Status: DC | PRN
  Administered 2018-10-13: 100 mg via INTRAVENOUS

## 2018-10-13 MED ORDER — BUPIVACAINE IN DEXTROSE 0.75-8.25 % IT SOLN
INTRATHECAL | Status: DC | PRN
  Administered 2018-10-13: 1.6 mL via INTRATHECAL

## 2018-10-13 MED ORDER — PANTOPRAZOLE SODIUM 40 MG PO TBEC
40.0000 mg | DELAYED_RELEASE_TABLET | Freq: Every day | ORAL | Status: DC
  Administered 2018-10-14 – 2018-10-15 (×2): 40 mg via ORAL
  Filled 2018-10-13 (×2): qty 1

## 2018-10-13 MED ORDER — MIDAZOLAM HCL 2 MG/2ML IJ SOLN
1.0000 mg | INTRAMUSCULAR | Status: DC
  Filled 2018-10-13: qty 2

## 2018-10-13 MED ORDER — ONDANSETRON HCL 4 MG PO TABS: 4.0000 mg | ORAL_TABLET | Freq: Four times a day (QID) | ORAL | Status: DC | PRN

## 2018-10-13 MED ORDER — SODIUM CHLORIDE 0.9 % IV SOLN
INTRAVENOUS | Status: DC
  Administered 2018-10-13 – 2018-10-14 (×2): via INTRAVENOUS

## 2018-10-13 MED ORDER — PHENOL 1.4 % MT LIQD
1.0000 | OROMUCOSAL | Status: DC | PRN
  Filled 2018-10-13: qty 177

## 2018-10-13 MED ORDER — SENNOSIDES-DOCUSATE SODIUM 8.6-50 MG PO TABS: 1.0000 | ORAL_TABLET | Freq: Every evening | ORAL | Status: DC | PRN

## 2018-10-13 MED ORDER — CEFAZOLIN SODIUM-DEXTROSE 2-4 GM/100ML-% IV SOLN
2.0000 g | INTRAVENOUS | Status: AC
  Administered 2018-10-13: 09:00:00 2 g via INTRAVENOUS
  Filled 2018-10-13: qty 100

## 2018-10-13 MED ORDER — MIDAZOLAM HCL 2 MG/2ML IJ SOLN
INTRAMUSCULAR | Status: AC
  Filled 2018-10-13: qty 2

## 2018-10-13 MED ORDER — DEXAMETHASONE SODIUM PHOSPHATE 10 MG/ML IJ SOLN
INTRAMUSCULAR | Status: AC
  Filled 2018-10-13: qty 1

## 2018-10-13 MED ORDER — TRAMADOL HCL 50 MG PO TABS
50.0000 mg | ORAL_TABLET | Freq: Four times a day (QID) | ORAL | Status: DC
  Administered 2018-10-13 – 2018-10-15 (×7): 50 mg via ORAL
  Filled 2018-10-13 (×7): qty 1

## 2018-10-13 MED ORDER — FENTANYL CITRATE (PF) 100 MCG/2ML IJ SOLN
50.0000 ug | INTRAMUSCULAR | Status: DC
  Administered 2018-10-13: 50 ug via INTRAVENOUS
  Filled 2018-10-13: qty 2

## 2018-10-13 MED ORDER — BUPIVACAINE-EPINEPHRINE (PF) 0.25% -1:200000 IJ SOLN
INTRAMUSCULAR | Status: AC
  Filled 2018-10-13: qty 30

## 2018-10-13 MED ORDER — SODIUM CHLORIDE 0.9 % IR SOLN
Status: DC | PRN
  Administered 2018-10-13: 1000 mL

## 2018-10-13 MED ORDER — ACETAMINOPHEN 500 MG PO TABS
1000.0000 mg | ORAL_TABLET | Freq: Four times a day (QID) | ORAL | Status: AC
  Administered 2018-10-13 – 2018-10-14 (×4): 1000 mg via ORAL
  Filled 2018-10-13 (×4): qty 2

## 2018-10-13 MED ORDER — BISACODYL 5 MG PO TBEC
5.0000 mg | DELAYED_RELEASE_TABLET | Freq: Every day | ORAL | Status: DC | PRN
  Administered 2018-10-13 – 2018-10-15 (×2): 5 mg via ORAL
  Filled 2018-10-13 (×2): qty 1

## 2018-10-13 MED ORDER — TRANEXAMIC ACID-NACL 1000-0.7 MG/100ML-% IV SOLN
1000.0000 mg | INTRAVENOUS | Status: AC
  Administered 2018-10-13: 10:00:00 1000 mg via INTRAVENOUS
  Filled 2018-10-13: qty 100

## 2018-10-13 MED ORDER — DIPHENHYDRAMINE HCL 12.5 MG/5ML PO ELIX: 12.5000 mg | ORAL_SOLUTION | ORAL | Status: DC | PRN

## 2018-10-13 MED ORDER — GABAPENTIN 300 MG PO CAPS
300.0000 mg | ORAL_CAPSULE | Freq: Three times a day (TID) | ORAL | Status: DC
  Administered 2018-10-13 – 2018-10-15 (×6): 300 mg via ORAL
  Filled 2018-10-13 (×6): qty 1

## 2018-10-13 MED ORDER — METOCLOPRAMIDE HCL 5 MG PO TABS: 5.0000 mg | ORAL_TABLET | Freq: Three times a day (TID) | ORAL | Status: DC | PRN

## 2018-10-13 MED ORDER — OXYCODONE HCL 5 MG PO TABS
5.0000 mg | ORAL_TABLET | ORAL | Status: DC | PRN
  Administered 2018-10-14 – 2018-10-15 (×2): 10 mg via ORAL
  Filled 2018-10-13 (×2): qty 2

## 2018-10-13 MED ORDER — LACTATED RINGERS IV SOLN
INTRAVENOUS | Status: DC
  Administered 2018-10-13 (×3): via INTRAVENOUS

## 2018-10-13 MED ORDER — DOCUSATE SODIUM 100 MG PO CAPS
100.0000 mg | ORAL_CAPSULE | Freq: Two times a day (BID) | ORAL | Status: DC
  Administered 2018-10-13 – 2018-10-15 (×4): 100 mg via ORAL
  Filled 2018-10-13 (×4): qty 1

## 2018-10-13 MED ORDER — ONDANSETRON HCL 4 MG/2ML IJ SOLN: 4.0000 mg | Freq: Once | INTRAMUSCULAR | Status: DC | PRN

## 2018-10-13 MED ORDER — EPHEDRINE SULFATE 50 MG/ML IJ SOLN
INTRAMUSCULAR | Status: DC | PRN
  Administered 2018-10-13: 10 mg via INTRAVENOUS

## 2018-10-13 MED ORDER — SODIUM CHLORIDE 0.9% FLUSH
INTRAVENOUS | Status: DC | PRN
  Administered 2018-10-13: 20 mL

## 2018-10-13 MED ORDER — SODIUM CHLORIDE 0.9 % IV SOLN
INTRAVENOUS | Status: DC | PRN
  Administered 2018-10-13: 10:00:00 25 ug/min via INTRAVENOUS

## 2018-10-13 MED ORDER — METHOCARBAMOL 500 MG IVPB - SIMPLE MED
500.0000 mg | Freq: Four times a day (QID) | INTRAVENOUS | Status: DC | PRN
  Filled 2018-10-13: qty 50

## 2018-10-13 MED ORDER — BUPIVACAINE-EPINEPHRINE 0.25% -1:200000 IJ SOLN
INTRAMUSCULAR | Status: DC | PRN
  Administered 2018-10-13: 30 mL

## 2018-10-13 MED ORDER — MENTHOL 3 MG MT LOZG: 1.0000 | LOZENGE | OROMUCOSAL | Status: DC | PRN

## 2018-10-13 MED ORDER — FENTANYL CITRATE (PF) 100 MCG/2ML IJ SOLN: 25.0000 ug | INTRAMUSCULAR | Status: DC | PRN

## 2018-10-13 MED ORDER — TRIAMTERENE-HCTZ 37.5-25 MG PO TABS
1.0000 | ORAL_TABLET | Freq: Every day | ORAL | Status: DC
  Administered 2018-10-14 – 2018-10-15 (×2): 1 via ORAL
  Filled 2018-10-13 (×2): qty 1

## 2018-10-13 MED ORDER — FENTANYL CITRATE (PF) 100 MCG/2ML IJ SOLN
INTRAMUSCULAR | Status: DC | PRN
  Administered 2018-10-13: 50 ug via INTRAVENOUS
  Administered 2018-10-13 (×2): 25 ug via INTRAVENOUS

## 2018-10-13 MED ORDER — METOCLOPRAMIDE HCL 5 MG/ML IJ SOLN: 5.0000 mg | Freq: Three times a day (TID) | INTRAMUSCULAR | Status: DC | PRN

## 2018-10-13 MED ORDER — ACETAMINOPHEN 500 MG PO TABS
1000.0000 mg | ORAL_TABLET | Freq: Once | ORAL | Status: AC
  Administered 2018-10-13: 08:00:00 1000 mg via ORAL
  Filled 2018-10-13: qty 2

## 2018-10-13 MED ORDER — TRANEXAMIC ACID-NACL 1000-0.7 MG/100ML-% IV SOLN
1000.0000 mg | Freq: Once | INTRAVENOUS | Status: AC
  Administered 2018-10-13: 14:00:00 1000 mg via INTRAVENOUS
  Filled 2018-10-13: qty 100

## 2018-10-13 MED ORDER — LEVOTHYROXINE SODIUM 88 MCG PO TABS
88.0000 ug | ORAL_TABLET | Freq: Every day | ORAL | Status: DC
  Administered 2018-10-14 – 2018-10-15 (×2): 88 ug via ORAL
  Filled 2018-10-13 (×2): qty 1

## 2018-10-13 MED ORDER — FLEET ENEMA 7-19 GM/118ML RE ENEM: 1.0000 | ENEMA | Freq: Once | RECTAL | Status: DC | PRN

## 2018-10-13 MED ORDER — ONDANSETRON HCL 4 MG/2ML IJ SOLN
INTRAMUSCULAR | Status: DC | PRN
  Administered 2018-10-13: 4 mg via INTRAVENOUS

## 2018-10-13 MED ORDER — METHOCARBAMOL 500 MG PO TABS
500.0000 mg | ORAL_TABLET | Freq: Four times a day (QID) | ORAL | Status: DC | PRN
  Administered 2018-10-13: 22:00:00 500 mg via ORAL
  Filled 2018-10-13: qty 1

## 2018-10-13 MED ORDER — ZOLPIDEM TARTRATE 5 MG PO TABS: 5.0000 mg | ORAL_TABLET | Freq: Every evening | ORAL | Status: DC | PRN

## 2018-10-13 MED ORDER — SODIUM CHLORIDE (PF) 0.9 % IJ SOLN
INTRAMUSCULAR | Status: AC
  Filled 2018-10-13: qty 20

## 2018-10-13 MED ORDER — ONDANSETRON HCL 4 MG/2ML IJ SOLN: 4.0000 mg | Freq: Four times a day (QID) | INTRAMUSCULAR | Status: DC | PRN

## 2018-10-13 MED ORDER — CEFAZOLIN SODIUM-DEXTROSE 2-4 GM/100ML-% IV SOLN
2.0000 g | Freq: Four times a day (QID) | INTRAVENOUS | Status: AC
  Administered 2018-10-13 (×2): 2 g via INTRAVENOUS
  Filled 2018-10-13 (×2): qty 100

## 2018-10-13 MED ORDER — CHLORHEXIDINE GLUCONATE 4 % EX LIQD: 60.0000 mL | Freq: Once | CUTANEOUS | Status: DC

## 2018-10-13 MED ORDER — FENTANYL CITRATE (PF) 100 MCG/2ML IJ SOLN
INTRAMUSCULAR | Status: AC
  Filled 2018-10-13: qty 2

## 2018-10-13 MED ORDER — HYDROMORPHONE HCL 1 MG/ML IJ SOLN: 0.5000 mg | INTRAMUSCULAR | Status: DC | PRN

## 2018-10-13 MED ORDER — DEXAMETHASONE SODIUM PHOSPHATE 10 MG/ML IJ SOLN
8.0000 mg | Freq: Once | INTRAMUSCULAR | Status: AC
  Administered 2018-10-13: 7 mg via INTRAVENOUS

## 2018-10-13 MED ORDER — GABAPENTIN 300 MG PO CAPS: 300.0000 mg | ORAL_CAPSULE | Freq: Three times a day (TID) | ORAL | Status: DC

## 2018-10-13 MED ORDER — FERROUS SULFATE 325 (65 FE) MG PO TABS
325.0000 mg | ORAL_TABLET | Freq: Three times a day (TID) | ORAL | Status: DC
  Administered 2018-10-13 – 2018-10-15 (×5): 325 mg via ORAL
  Filled 2018-10-13 (×5): qty 1

## 2018-10-13 MED ORDER — BUPIVACAINE LIPOSOME 1.3 % IJ SUSP
INTRAMUSCULAR | Status: DC | PRN
  Administered 2018-10-13: 20 mL

## 2018-10-13 MED ORDER — CELECOXIB 200 MG PO CAPS
400.0000 mg | ORAL_CAPSULE | Freq: Once | ORAL | Status: AC
  Administered 2018-10-13: 08:00:00 400 mg via ORAL
  Filled 2018-10-13: qty 2

## 2018-10-13 MED ORDER — GABAPENTIN 300 MG PO CAPS
300.0000 mg | ORAL_CAPSULE | Freq: Once | ORAL | Status: DC
  Filled 2018-10-13: qty 1

## 2018-10-13 MED ORDER — PROPOFOL 500 MG/50ML IV EMUL
INTRAVENOUS | Status: DC | PRN
  Administered 2018-10-13: 50 ug/kg/min via INTRAVENOUS

## 2018-10-13 MED ORDER — PROPOFOL 10 MG/ML IV BOLUS
INTRAVENOUS | Status: AC
  Filled 2018-10-13: qty 60

## 2018-10-13 MED ORDER — ONDANSETRON HCL 4 MG/2ML IJ SOLN
INTRAMUSCULAR | Status: AC
  Filled 2018-10-13: qty 2

## 2018-10-13 MED ORDER — POVIDONE-IODINE 10 % EX SWAB
2.0000 "application " | Freq: Once | CUTANEOUS | Status: AC
  Administered 2018-10-13: 2 via TOPICAL

## 2018-10-13 MED ORDER — ALUM & MAG HYDROXIDE-SIMETH 200-200-20 MG/5ML PO SUSP: 30.0000 mL | ORAL | Status: DC | PRN

## 2018-10-13 SURGICAL SUPPLY — 54 items
ARTISURF 10M PLY L 6-9CD KNEE (Knees) ×2 IMPLANT
BAG ZIPLOCK 12X15 (MISCELLANEOUS) ×1 IMPLANT
BLADE SAGITTAL 13X1.27X60 (BLADE) ×2 IMPLANT
BLADE SAGITTAL 13X1.27X60MM (BLADE) ×1
BLADE SAW SGTL 83.5X18.5 (BLADE) ×3 IMPLANT
BLADE SURG 15 STRL LF DISP TIS (BLADE) ×1 IMPLANT
BLADE SURG 15 STRL SS (BLADE) ×2
BNDG ELASTIC 6X5.8 VLCR STR LF (GAUZE/BANDAGES/DRESSINGS) ×3 IMPLANT
BOWL SMART MIX CTS (DISPOSABLE) ×3 IMPLANT
CEMENT BONE SIMPLEX SPEEDSET (Cement) ×6 IMPLANT
CLOSURE WOUND 1/2 X4 (GAUZE/BANDAGES/DRESSINGS) ×1
COMP FEM PERSONA SZ8 LT (Joint) ×3 IMPLANT
COMPONENT FEM PERSONA SZ8 LT (Joint) IMPLANT
COVER SURGICAL LIGHT HANDLE (MISCELLANEOUS) ×3 IMPLANT
COVER WAND RF STERILE (DRAPES) IMPLANT
CUFF TOURN SGL QUICK 34 (TOURNIQUET CUFF) ×2
CUFF TRNQT CYL 34X4.125X (TOURNIQUET CUFF) ×1 IMPLANT
DECANTER SPIKE VIAL GLASS SM (MISCELLANEOUS) ×6 IMPLANT
DRAPE INCISE IOBAN 66X45 STRL (DRAPES) ×6 IMPLANT
DRAPE U-SHAPE 47X51 STRL (DRAPES) ×3 IMPLANT
DRILL PIN HEADLESS TROCAR 3X75 (PIN) ×2 IMPLANT
DRSG AQUACEL AG ADV 3.5X10 (GAUZE/BANDAGES/DRESSINGS) ×3 IMPLANT
DURAPREP 26ML APPLICATOR (WOUND CARE) ×6 IMPLANT
ELECT REM PT RETURN 15FT ADLT (MISCELLANEOUS) ×3 IMPLANT
GLOVE BIOGEL PI IND STRL 8.5 (GLOVE) ×2 IMPLANT
GLOVE BIOGEL PI INDICATOR 8.5 (GLOVE) ×4
GLOVE SURG ORTHO 8.0 STRL STRW (GLOVE) ×9 IMPLANT
GOWN STRL REUS W/ TWL XL LVL3 (GOWN DISPOSABLE) ×2 IMPLANT
GOWN STRL REUS W/TWL XL LVL3 (GOWN DISPOSABLE) ×4
HANDPIECE INTERPULSE COAX TIP (DISPOSABLE) ×2
HOLDER FOLEY CATH W/STRAP (MISCELLANEOUS) ×3 IMPLANT
HOOD PEEL AWAY FLYTE STAYCOOL (MISCELLANEOUS) ×9 IMPLANT
KIT TURNOVER KIT A (KITS) IMPLANT
MANIFOLD NEPTUNE II (INSTRUMENTS) ×3 IMPLANT
NEEDLE HYPO 22GX1.5 SAFETY (NEEDLE) ×3 IMPLANT
NS IRRIG 1000ML POUR BTL (IV SOLUTION) ×3 IMPLANT
PACK TOTAL KNEE CUSTOM (KITS) ×3 IMPLANT
PROTECTOR NERVE ULNAR (MISCELLANEOUS) ×3 IMPLANT
SET HNDPC FAN SPRY TIP SCT (DISPOSABLE) ×1 IMPLANT
STEM POLY PAT PLY 32M KNEE (Knees) ×2 IMPLANT
STEM TIBIA 5 DEG SZ D L KNEE (Knees) IMPLANT
STRIP CLOSURE SKIN 1/2X4 (GAUZE/BANDAGES/DRESSINGS) ×2 IMPLANT
SUT BONE WAX W31G (SUTURE) ×3 IMPLANT
SUT MNCRL AB 3-0 PS2 18 (SUTURE) ×3 IMPLANT
SUT STRATAFIX 0 PDS 27 VIOLET (SUTURE) ×3
SUT STRATAFIX PDS+ 0 24IN (SUTURE) ×3 IMPLANT
SUT VIC AB 1 CT1 36 (SUTURE) ×3 IMPLANT
SUTURE STRATFX 0 PDS 27 VIOLET (SUTURE) ×1 IMPLANT
SYR CONTROL 10ML LL (SYRINGE) ×6 IMPLANT
TIBIA STEM 5 DEG SZ D L KNEE (Knees) ×3 IMPLANT
TRAY FOLEY MTR SLVR 14FR STAT (SET/KITS/TRAYS/PACK) ×2 IMPLANT
WATER STERILE IRR 1000ML POUR (IV SOLUTION) ×6 IMPLANT
WRAP KNEE MAXI GEL POST OP (GAUZE/BANDAGES/DRESSINGS) ×3 IMPLANT
YANKAUER SUCT BULB TIP 10FT TU (MISCELLANEOUS) ×3 IMPLANT

## 2018-10-13 NOTE — Anesthesia Preprocedure Evaluation (Addendum)
Anesthesia Evaluation  Patient identified by MRN, date of birth, ID band Patient awake and Patient confused    Reviewed: Allergy & Precautions, NPO status , Patient's Chart, lab work & pertinent test results  Airway Mallampati: II  TM Distance: >3 FB Neck ROM: Full    Dental  (+) Teeth Intact, Dental Advisory Given   Pulmonary    breath sounds clear to auscultation       Cardiovascular hypertension,  Rhythm:Regular Rate:Normal     Neuro/Psych    GI/Hepatic   Endo/Other    Renal/GU      Musculoskeletal   Abdominal   Peds  Hematology   Anesthesia Other Findings   Reproductive/Obstetrics                            Anesthesia Physical Anesthesia Plan  ASA: III  Anesthesia Plan: Spinal   Post-op Pain Management:  Regional for Post-op pain   Induction:   PONV Risk Score and Plan: Ondansetron and Propofol infusion  Airway Management Planned: Natural Airway and Simple Face Mask  Additional Equipment:   Intra-op Plan:   Post-operative Plan:   Informed Consent: I have reviewed the patients History and Physical, chart, labs and discussed the procedure including the risks, benefits and alternatives for the proposed anesthesia with the patient or authorized representative who has indicated his/her understanding and acceptance.     Dental advisory given  Plan Discussed with: CRNA and Anesthesiologist  Anesthesia Plan Comments:        Anesthesia Quick Evaluation

## 2018-10-13 NOTE — Plan of Care (Signed)
  Problem: Education: Goal: Knowledge of the prescribed therapeutic regimen will improve Outcome: Not Progressing   Problem: Activity: Goal: Ability to avoid complications of mobility impairment will improve Outcome: Not Progressing Goal: Range of joint motion will improve Outcome: Not Progressing   Problem: Clinical Measurements: Goal: Postoperative complications will be avoided or minimized Outcome: Not Progressing   Problem: Pain Management: Goal: Pain level will decrease with appropriate interventions Outcome: Not Progressing   Problem: Skin Integrity: Goal: Will show signs of wound healing Outcome: Not Progressing   Problem: Education: Goal: Knowledge of General Education information will improve Description: Including pain rating scale, medication(s)/side effects and non-pharmacologic comfort measures Outcome: Not Progressing   Problem: Health Behavior/Discharge Planning: Goal: Ability to manage health-related needs will improve Outcome: Not Progressing   Problem: Clinical Measurements: Goal: Ability to maintain clinical measurements within normal limits will improve Outcome: Not Progressing Goal: Will remain free from infection Outcome: Not Progressing Goal: Diagnostic test results will improve Outcome: Not Progressing Goal: Respiratory complications will improve Outcome: Not Progressing Goal: Cardiovascular complication will be avoided Outcome: Not Progressing   Problem: Activity: Goal: Risk for activity intolerance will decrease Outcome: Not Progressing   Problem: Nutrition: Goal: Adequate nutrition will be maintained Outcome: Not Progressing   Problem: Coping: Goal: Level of anxiety will decrease Outcome: Not Progressing   Problem: Elimination: Goal: Will not experience complications related to bowel motility Outcome: Not Progressing Goal: Will not experience complications related to urinary retention Outcome: Not Progressing   Problem: Pain  Managment: Goal: General experience of comfort will improve Outcome: Not Progressing   Problem: Safety: Goal: Ability to remain free from injury will improve Outcome: Not Progressing   Problem: Skin Integrity: Goal: Risk for impaired skin integrity will decrease Outcome: Not Progressing

## 2018-10-13 NOTE — Op Note (Signed)
TOTAL KNEE REPLACEMENT OPERATIVE NOTE:  10/13/2018  11:21 AM  PATIENT:  Patricia Mason  85 y.o. female  PRE-OPERATIVE DIAGNOSIS:  LEFT KNEE OSTEOARTHRITIS  POST-OPERATIVE DIAGNOSIS:  LEFT KNEE OSTEOARTHRITIS  PROCEDURE:  Procedure(s): TOTAL KNEE ARTHROPLASTY  SURGEON:  Surgeon(s): Vickey Huger, MD  PHYSICIAN ASSISTANT: Carlyon Shadow, PA-C   ANESTHESIA:   none  SPECIMEN: None  COUNTS:  Correct  TOURNIQUET:   Total Tourniquet Time Documented: Thigh (Left) - 37 minutes Total: Thigh (Left) - 37 minutes   DICTATION:  Indication for procedure:    The patient is a 85 y.o. female who has failed conservative treatment for LEFT KNEE OSTEOARTHRITIS.  Informed consent was obtained prior to anesthesia. The risks versus benefits of the operation were explain and in a way the patient can, and did, understand.   Description of procedure:     The patient was taken to the operating room and placed under anesthesia.  The patient was positioned in the usual fashion taking care that all body parts were adequately padded and/or protected.  A tourniquet was applied and the leg prepped and draped in the usual sterile fashion.  The extremity was exsanguinated with the esmarch and tourniquet inflated to 350 mmHg.  Pre-operative range of motion was normal.  A midline incision approximately 6-7 inches long was made with a #10 blade.  A new blade was used to make a parapatellar arthrotomy going 2-3 cm into the quadriceps tendon, over the patella, and alongside the medial aspect of the patellar tendon.  A synovectomy was then performed with the #10 blade and forceps. I then elevated the deep MCL off the medial tibial metaphysis subperiosteally around to the semimembranosus attachment.    I everted the patella and used calipers to measure patellar thickness.  I used the reamer to ream down to appropriate thickness to recreate the native thickness.  I then removed excess bone with the rongeur and sagittal  saw.  I used the appropriately sized template and drilled the three lug holes.  I then put the trial in place and measured the thickness with the calipers to ensure recreation of the native thickness.  The trial was then removed and the patella subluxed and the knee brought into flexion.  A homan retractor was place to retract and protect the patella and lateral structures.  A Z-retractor was place medially to protect the medial structures.  The extra-medullary alignment system was used to make cut the tibial articular surface perpendicular to the anamotic axis of the tibia and in 3 degrees of posterior slope.  The cut surface and alignment jig was removed.  I then used the intramedullary alignment guide to make a valgus cut on the distal femur.  I then marked out the epicondylar axis on the distal femur.   I then used the anterior referencing sizer and measured the femur to be a size 8.  The 4-In-1 cutting block was screwed into place in external rotation matching the posterior condylar angle, making our cuts perpendicular to the epicondylar axis.  Anterior, posterior and chamfer cuts were made with the sagittal saw.  The cutting block and cut pieces were removed.  A lamina spreader was placed in 90 degrees of flexion.  The ACL, PCL, menisci, and posterior condylar osteophytes were removed.  A 10 mm spacer blocked was found to offer good flexion and extension gap balance after minimal in degree releasing.   The scoop retractor was then placed and the femoral finishing block was pinned in  place.  The small sagittal saw was used as well as the lug drill to finish the femur.  The block and cut surfaces were removed and the medullary canal hole filled with autograft bone from the cut pieces.  The tibia was delivered forward in deep flexion and external rotation.  A size D tray was selected and pinned into place centered on the medial 1/3 of the tibial tubercle.  The reamer and keel was used to prepare the tibia  through the tray.    I then trialed with the size 10 femur, size D tibia, a 10 mm insert and the 32 patella.  I had excellent flexion/extension gap balance, excellent patella tracking.  Flexion was full and beyond 120 degrees; extension was zero.  These components were chosen and the staff opened them to me on the back table while the knee was lavaged copiously and the cement mixed.  The soft tissue was infiltrated with 60cc of exparel 1.3% through a 21 gauge needle.  I cemented in the components and removed all excess cement.  The polyethylene tibial component was snapped into place and the knee placed in extension while cement was hardening.  The capsule was infilltrated with a 60cc exparel/marcaine/saline mixture.   Once the cement was hard, the tourniquet was let down.  Hemostasis was obtained.  The arthrotomy was closed using a #1 stratofix running suture.  The deep soft tissues were closed with #0 vicryls and the subcuticular layer closed with #2-0 vicryl.  The skin was reapproximated and closed with 3.0 Monocryl.  The wound was covered with steristrips, aquacel dressing, and a TED stocking.   The patient was then awakened, extubated, and taken to the recovery room in stable condition.  BLOOD LOSS:  426ST COMPLICATIONS:  None.  PLAN OF CARE: Admit for overnight observation  PATIENT DISPOSITION:  PACU - hemodynamically stable.    Please fax a copy of this op note to my office at [phone] (please only include page 1 and 2 of the Case Information op note)

## 2018-10-13 NOTE — Transfer of Care (Signed)
Immediate Anesthesia Transfer of Care Note  Patient: Patricia Mason  Procedure(s) Performed: TOTAL KNEE ARTHROPLASTY (Left Knee)  Patient Location: PACU  Anesthesia Type:General  Level of Consciousness: awake, alert , oriented and patient cooperative  Airway & Oxygen Therapy: Patient Spontanous Breathing and Patient connected to face mask oxygen  Post-op Assessment: Report given to RN, Post -op Vital signs reviewed and stable and Patient moving all extremities  Post vital signs: Reviewed and stable  Last Vitals:  Vitals Value Taken Time  BP    Temp    Pulse    Resp    SpO2      Last Pain:  Vitals:   10/13/18 0915  TempSrc:   PainSc: 0-No pain         Complications: No apparent anesthesia complications

## 2018-10-13 NOTE — Anesthesia Procedure Notes (Signed)
Procedure Name: MAC Date/Time: 10/13/2018 9:31 AM Performed by: Lissa Morales, CRNA Pre-anesthesia Checklist: Patient identified, Emergency Drugs available, Suction available, Patient being monitored and Timeout performed Patient Re-evaluated:Patient Re-evaluated prior to induction Oxygen Delivery Method: Simple face mask Placement Confirmation: positive ETCO2

## 2018-10-13 NOTE — Progress Notes (Signed)
SPORTS MEDICINE AND JOINT REPLACEMENT  [name], MD    Carlyon Shadow, PA-C [address], [place], [place]  [address]                             [phone]   PROGRESS NOTE  Subjective:  negative for Chest Pain  negative for Shortness of Breath  negative for Nausea/Vomiting   negative for Calf Pain  negative for Bowel Movement   Tolerating Diet: yes         Patient reports pain as 3 on 0-10 scale.    Objective: Vital signs in last 24 hours:    Patient Vitals for the past 24 hrs:  BP Temp Temp src Pulse Resp SpO2 Height Weight  10/13/18 1709 - - - - - - - 68.9 kg  10/13/18 1708 - - - - - - 5\' 6"  (1.676 m) -  10/13/18 1530 139/81 97.6 F (36.4 C) - 67 16 100 % - -  10/13/18 1419 (!) 141/66 98.1 F (36.7 C) - 64 16 100 % - -  10/13/18 1322 (!) 145/76 98 F (36.7 C) - 62 17 100 % - -  10/13/18 1300 (!) 152/71 98 F (36.7 C) Axillary 64 14 100 % - -  10/13/18 1256 (!) 152/71 98 F (36.7 C) Axillary 64 14 100 % - -  10/13/18 1229 (!) 152/71 98 F (36.7 C) Axillary 64 14 100 % - -  10/13/18 1200 131/64 (!) 97.5 F (36.4 C) - 62 12 100 % - -  10/13/18 1145 139/63 - - 68 16 100 % - -  10/13/18 1130 139/63 - - 66 14 99 % - -  10/13/18 1115 (!) 133/56 - - 74 16 100 % - -  10/13/18 1107 (!) 121/56 (!) 97.5 F (36.4 C) - 74 15 100 % - -  10/13/18 0916 120/60 - - - - - - -  10/13/18 0915 - - - 70 15 100 % - -  10/13/18 0910 140/62 - - 64 12 100 % - -  10/13/18 0905 (!) 147/62 - - 64 15 100 % - -  10/13/18 0805 132/64 98.2 F (36.8 C) Oral 74 18 97 % - -    @flow {1959:LAST@   Intake/Output from previous day:   No intake/output data recorded.   Intake/Output this shift:   No intake/output data recorded.   Intake/Output      07/27 0701 - 07/28 0700   P.O. 240   I.V. (mL/kg) 2434.1 (35.3)   IV Piggyback 400   Total Intake(mL/kg) 3074.1 (44.6)   Urine (mL/kg/hr) 100 (0.1)   Blood 50   Total Output 150   Net +2924.1          LABORATORY DATA: Recent  Labs    10/10/18 1439  WBC 5.9  HGB 12.2  HCT 38.7  PLT 226   Recent Labs    10/10/18 1439  NA 141  K 4.1  CL 103  CO2 29  BUN 14  CREATININE 0.82  GLUCOSE 108*  CALCIUM 8.7*   No results found for: INR, PROTIME  Examination:  General appearance: alert, cooperative and no distress Extremities: extremities normal, atraumatic, no cyanosis or edema  Wound Exam: clean, dry, intact   Drainage:  None: wound tissue dry  Motor Exam: Quadriceps and Hamstrings Intact  Sensory Exam: Superficial Peroneal, Deep Peroneal and Tibial normal   Assessment:    Day of Surgery  Procedure(s) (LRB): TOTAL KNEE ARTHROPLASTY (Left)  ADDITIONAL DIAGNOSIS:  Active Problems:   S/P total knee replacement     Plan: Physical Therapy as ordered Weight Bearing as Tolerated (WBAT)  DVT Prophylaxis:  Aspirin  DISCHARGE PLAN: Skilled Nursing Facility/Rehab     Patient doing very well. She is anticipating D/C to a SNF due to lack of help at home. Will go tomorrow or Wednesday depending on bed availability   Patient's anticipated LOS is less than 2 midnights, meeting these requirements: - Lives within 1 hour of care - Has a competent adult at home to recover with post-op recover - NO history of  - Chronic pain requiring opiods  - Diabetes  - Coronary Artery Disease  - Heart failure  - Heart attack  - Stroke  - DVT/VTE  - Cardiac arrhythmia  - Respiratory Failure/COPD  - Renal failure  - Anemia  - Advanced Liver disease        Donia Ast 10/13/2018, 7:50 PM

## 2018-10-13 NOTE — Progress Notes (Signed)
AssistedDr. Joslin with left, ultrasound guided, adductor canal block. Side rails up, monitors on throughout procedure. See vital signs in flow sheet. Tolerated Procedure well.  

## 2018-10-13 NOTE — Anesthesia Procedure Notes (Signed)
Anesthesia Regional Block: Adductor canal block   Pre-Anesthetic Checklist: ,, timeout performed, Correct Patient, Correct Site, Correct Laterality, Correct Procedure, Correct Position, site marked, Risks and benefits discussed, pre-op evaluation,  At surgeon's request and post-op pain management  Laterality: Left  Prep: Maximum Sterile Barrier Precautions used, chloraprep       Needles:  Injection technique: Single-shot  Needle Type: Echogenic Stimulator Needle     Needle Length: 9cm  Needle Gauge: 21     Additional Needles:   Procedures:,,,, ultrasound used (permanent image in chart),,,,  Narrative:  Start time: 10/13/2018 9:10 AM End time: 10/13/2018 9:15 AM Injection made incrementally with aspirations every 5 mL.  Performed by: Personally  Anesthesiologist: Roberts Gaudy, MD  Additional Notes: 20 cc 0.75% Ropivacaine injected easily

## 2018-10-13 NOTE — Anesthesia Postprocedure Evaluation (Addendum)
Anesthesia Post Note  Patient: Patricia Mason  Procedure(s) Performed: TOTAL KNEE ARTHROPLASTY (Left Knee)     Patient had knee replacement. Spinal anesthesia was performed but was then converted to general anesthesia. Patient tolerated anesthesia well. She had stable post-op course. Last Vitals:  Vitals:   10/13/18 1145 10/13/18 1200  BP: 139/63 131/64  Pulse: 68 62  Resp: 16 12  Temp:  (!) 36.4 C  SpO2: 100% 100%    Last Pain:  Vitals:   10/13/18 1200  TempSrc:   PainSc: 0-No pain                 Rahiem Schellinger COKER

## 2018-10-13 NOTE — Anesthesia Procedure Notes (Signed)
Date/Time: 10/13/2018 10:54 AM Performed by: Lissa Morales, CRNA Pre-anesthesia Checklist: Patient identified, Emergency Drugs available, Suction available and Patient being monitored Patient Re-evaluated:Patient Re-evaluated prior to induction Oxygen Delivery Method: Circle system utilized Preoxygenation: Pre-oxygenation with 100% oxygen Induction Type: IV induction LMA: LMA with gastric port inserted LMA Size: 4.0 Tube type: Oral Tube size: 4.0 mm Number of attempts: 1 Airway Equipment and Method: Stylet and Oral airway Placement Confirmation: positive ETCO2 and breath sounds checked- equal and bilateral Tube secured with: Tape Dental Injury: Teeth and Oropharynx as per pre-operative assessment

## 2018-10-13 NOTE — Evaluation (Signed)
Physical Therapy Evaluation Patient Details Name: Patricia Mason MRN: [id number] DOB: 21-Nov-1930 Today's Date: 10/13/2018   History of Present Illness  85 yo female s/t L TKA. PMH: R TKA,HTN, neuropathy  Clinical Impression  Pt is s/p TKA resulting in the deficits listed below (see PT Problem List).  Pt amb 60' with RW and min assist. Anticipate steady progress. Plan is  for SNF Pt will benefit from skilled PT to increase their independence and safety with mobility to allow discharge to the venue listed below.      Follow Up Recommendations SNF    Equipment Recommendations  None recommended by PT    Recommendations for Other Services       Precautions / Restrictions Precautions Precautions: Fall;Knee Restrictions Weight Bearing Restrictions: No Other Position/Activity Restrictions: WBAT      Mobility  Bed Mobility Overal bed mobility: Needs Assistance Bed Mobility: Supine to Sit     Supine to sit: Min guard     General bed mobility comments: for safety,mild dizziness on initial sitting  Transfers Overall transfer level: Needs assistance Equipment used: Rolling walker (2 wheeled) Transfers: Sit to/from Stand Sit to Stand: Min assist         General transfer comment: assist to rise and transition to RW, cues for hand placement  Ambulation/Gait Ambulation/Gait assistance: Min guard;Min assist Gait Distance (Feet): 60 Feet Assistive device: Rolling walker (2 wheeled) Gait Pattern/deviations: Step-to pattern;Decreased weight shift to left     General Gait Details: cues for sequence and RW position  Stairs            Wheelchair Mobility    Modified Rankin (Stroke Patients Only)       Balance                                             Pertinent Vitals/Pain Pain Assessment: 0-10 Pain Score: 4  Pain Location: L knee Pain Descriptors / Indicators: Sore Pain Intervention(s): Monitored during session;Premedicated before  session;Repositioned;Limited activity within patient's tolerance;Ice applied    Home Living Family/patient expects to be discharged to:: Skilled nursing facility Living Arrangements: Alone                    Prior Function Level of Independence: Independent         Comments: has RW and BSC, cane     Hand Dominance        Extremity/Trunk Assessment   Upper Extremity Assessment Upper Extremity Assessment: Overall WFL for tasks assessed    Lower Extremity Assessment Lower Extremity Assessment: LLE deficits/detail LLE Deficits / Details: ankle WFL, knee extension and hip flexion 2+/5 LLE Sensation: history of peripheral neuropathy(bil LEs)       Communication   Communication: No difficulties  Cognition Arousal/Alertness: Awake/alert   Overall Cognitive Status: Within Functional Limits for tasks assessed                                        General Comments      Exercises Total Joint Exercises Ankle Circles/Pumps: AROM;Both;10 reps Quad Sets: 10 reps;Both;AROM   Assessment/Plan    PT Assessment Patient needs continued PT services  PT Problem List Decreased strength;Decreased range of motion;Decreased activity tolerance;Pain;Decreased knowledge of use of DME;Decreased mobility  PT Treatment Interventions DME instruction;Gait training;Functional mobility training;Therapeutic activities;Patient/family education;Therapeutic exercise    PT Goals (Current goals can be found in the Care Plan section)  Acute Rehab PT Goals Patient Stated Goal: to go to rehab PT Goal Formulation: With patient Time For Goal Achievement: 10/20/18 Potential to Achieve Goals: Good    Frequency 7X/week   Barriers to discharge        Co-evaluation               AM-PAC PT "6 Clicks" Mobility  Outcome Measure Help needed turning from your back to your side while in a flat bed without using bedrails?: A Little Help needed moving from lying on  your back to sitting on the side of a flat bed without using bedrails?: A Little Help needed moving to and from a bed to a chair (including a wheelchair)?: A Little Help needed standing up from a chair using your arms (e.g., wheelchair or bedside chair)?: A Little Help needed to walk in hospital room?: A Little Help needed climbing 3-5 steps with a railing? : A Lot 6 Click Score: 17    End of Session Equipment Utilized During Treatment: Gait belt Activity Tolerance: Patient tolerated treatment well Patient left: in chair;with call bell/phone within reach;with chair alarm set   PT Visit Diagnosis: Difficulty in walking, not elsewhere classified (R26.2)    Time: 1732-1750 PT Time Calculation (min) (ACUTE ONLY): 18 min   Charges:   PT Evaluation $PT Eval Low Complexity: 1 Low          Kenyon Ana, PT  Pager: [phone] Acute Rehab Dept [facility]): [phone]   10/13/2018   [facility] 10/13/2018, 5:57 PM

## 2018-10-13 NOTE — H&P (Signed)
Patricia Mason MRN:  [id number] DOB/SEX:  [birth year]/female  CHIEF COMPLAINT:  Painful left Knee  HISTORY: Patient is a 85 y.o. female presented with a history of pain in the left knee. Onset of symptoms was gradual starting a few months ago with gradually worsening course since that time. Patient has been treated conservatively with over-the-counter NSAIDs and activity modification. Patient currently rates pain in the knee at 10 out of 10 with activity. There is pain at night.  PAST MEDICAL HISTORY: Patient Active Problem List   Diagnosis Date Noted  . Sensory neuropathy 09/15/2018  . Paresthesias 09/15/2018  . S/P total knee replacement 12/09/2017   Past Medical History:  Diagnosis Date  . Arthritis   . Cancer (HCC)    Skin-Squamous cell  . Complication of anesthesia   . Hypertension   . Hyperthyroidism   . Hypothyroidism   . Iron deficiency anemia   . Neuropathy   . PONV (postoperative nausea and vomiting)   . Vitamin B 12 deficiency    Past Surgical History:  Procedure Laterality Date  . ABDOMINAL HYSTERECTOMY    . COLONOSCOPY    . EYE SURGERY Bilateral 03/2016   Cataract  . MENISCUS REPAIR Right   . SQUAMOUS CELL CARCINOMA EXCISION Right 2018   Hand  . TONSILLECTOMY    . TOTAL KNEE ARTHROPLASTY Right 12/09/2017   Procedure: RIGHT TOTAL KNEE ARTHROPLASTY;  Surgeon: Vickey Huger, MD;  Location: [facility];  Service: Orthopedics;  Laterality: Right;  Adductor Block     MEDICATIONS:   No medications prior to admission.    ALLERGIES:   Allergies  Allergen Reactions  . Prednisone Hives  . Sulfa Antibiotics Anaphylaxis and Rash    Throat swelling/fever    REVIEW OF SYSTEMS:  A comprehensive review of systems was negative except for: Musculoskeletal: positive for arthralgias and bone pain   FAMILY HISTORY:  No family history on file.  SOCIAL HISTORY:   Social History   Tobacco Use  . Smoking status: Never Smoker  . Smokeless tobacco: Never Used  Substance  Use Topics  . Alcohol use: Never    Frequency: Never     EXAMINATION:  Vital signs in last 24 hours:    LMP 12/04/2017   General Appearance:    Alert, cooperative, no distress, appears stated age  Head:    Normocephalic, without obvious abnormality, atraumatic  Eyes:    PERRL, conjunctiva/corneas clear, EOM's intact, fundi    benign, both eyes  Ears:    Normal TM's and external ear canals, both ears  Nose:   Nares normal, septum midline, mucosa normal, no drainage    or sinus tenderness  Throat:   Lips, mucosa, and tongue normal; teeth and gums normal  Neck:   Supple, symmetrical, trachea midline, no adenopathy;    thyroid:  no enlargement/tenderness/nodules; no carotid   bruit or JVD  Back:     Symmetric, no curvature, ROM normal, no CVA tenderness  Lungs:     Clear to auscultation bilaterally, respirations unlabored  Chest Wall:    No tenderness or deformity   Heart:    Regular rate and rhythm, S1 and S2 normal, no murmur, rub   or gallop  Breast Exam:    No tenderness, masses, or nipple abnormality  Abdomen:     Soft, non-tender, bowel sounds active all four quadrants,    no masses, no organomegaly  Genitalia:    Normal female without lesion, discharge or tenderness  Rectal:    Normal  tone, normal prostate, no masses or tenderness;   guaiac negative stool  Extremities:   Extremities normal, atraumatic, no cyanosis or edema  Pulses:   2+ and symmetric all extremities  Skin:   Skin color, texture, turgor normal, no rashes or lesions  Lymph nodes:   Cervical, supraclavicular, and axillary nodes normal  Neurologic:   CNII-XII intact, normal strength, sensation and reflexes    throughout    Musculoskeletal:  ROM 0-120, Ligaments intact,  Imaging Review Plain radiographs demonstrate severe degenerative joint disease of the left knee. The overall alignment is neutral. The bone quality appears to be good for age and reported activity level.  Assessment/Plan: Primary  osteoarthritis, left knee   The patient history, physical examination and imaging studies are consistent with advanced degenerative joint disease of the left knee. The patient has failed conservative treatment.  The clearance notes were reviewed.  After discussion with the patient it was felt that Total Knee Replacement was indicated. The procedure,  risks, and benefits of total knee arthroplasty were presented and reviewed. The risks including but not limited to aseptic loosening, infection, blood clots, vascular injury, stiffness, patella tracking problems complications among others were discussed. The patient acknowledged the explanation, agreed to proceed with the plan.  Preoperative templating of the joint replacement has been completed, documented, and submitted to the Operating Room personnel in order to optimize intra-operative equipment management.    Patient's anticipated LOS is less than 2 midnights, meeting these requirements: - Lives within 1 hour of care - Has a competent adult at home to recover with post-op recover - NO history of  - Chronic pain requiring opiods  - Diabetes  - Coronary Artery Disease  - Heart failure  - Heart attack  - Stroke  - DVT/VTE  - Cardiac arrhythmia  - Respiratory Failure/COPD  - Renal failure  - Anemia  - Advanced Liver disease       Donia Ast 10/13/2018, 6:14 AM

## 2018-10-14 ENCOUNTER — Encounter (HOSPITAL_COMMUNITY): Payer: Self-pay | Admitting: Orthopedic Surgery

## 2018-10-14 DIAGNOSIS — M1712 Unilateral primary osteoarthritis, left knee: Secondary | ICD-10-CM | POA: Diagnosis not present

## 2018-10-14 LAB — CBC
HCT: 33.7 % — ABNORMAL LOW (ref 36.0–46.0)
Hemoglobin: 10.3 g/dL — ABNORMAL LOW (ref 12.0–15.0)
MCH: 30.6 pg (ref 26.0–34.0)
MCHC: 30.6 g/dL (ref 30.0–36.0)
MCV: 100 fL (ref 80.0–100.0)
Platelets: 205 10*3/uL (ref 150–400)
RBC: 3.37 MIL/uL — ABNORMAL LOW (ref 3.87–5.11)
RDW: 12.7 % (ref 11.5–15.5)
WBC: 6.5 10*3/uL (ref 4.0–10.5)
nRBC: 0 % (ref 0.0–0.2)

## 2018-10-14 LAB — BASIC METABOLIC PANEL
Anion gap: 8 (ref 5–15)
BUN: 13 mg/dL (ref 8–23)
CO2: 26 mmol/L (ref 22–32)
Calcium: 7.8 mg/dL — ABNORMAL LOW (ref 8.9–10.3)
Chloride: 104 mmol/L (ref 98–111)
Creatinine, Ser: 0.95 mg/dL (ref 0.44–1.00)
GFR calc Af Amer: >60 mL/min (ref >60)
GFR calc non Af Amer: 55 mL/min — ABNORMAL LOW (ref >60)
Glucose, Bld: 114 mg/dL — ABNORMAL HIGH (ref 70–99)
Potassium: 4 mmol/L (ref 3.5–5.1)
Sodium: 138 mmol/L (ref 135–145)

## 2018-10-14 LAB — SARS CORONAVIRUS 2 BY RT PCR (HOSPITAL ORDER, PERFORMED IN ~~LOC~~ HOSPITAL LAB): SARS Coronavirus 2: NEGATIVE

## 2018-10-14 MED ORDER — ASPIRIN 325 MG PO TBEC: 325.0000 mg | DELAYED_RELEASE_TABLET | Freq: Two times a day (BID) | ORAL | 0 refills | Status: DC

## 2018-10-14 MED ORDER — METHOCARBAMOL 500 MG PO TABS: 500.0000 mg | ORAL_TABLET | Freq: Four times a day (QID) | ORAL | 0 refills | Status: DC | PRN

## 2018-10-14 MED ORDER — OXYCODONE HCL 5 MG PO TABS: 5.0000 mg | ORAL_TABLET | Freq: Four times a day (QID) | ORAL | 0 refills | Status: DC | PRN

## 2018-10-14 NOTE — Progress Notes (Signed)
   10/14/18 1600  PT Visit Information  Last PT Received On 10/14/18--pt progressing well. Will benefit from SNF to maximize independence prior to return home alone. Continue PT POC  Assistance Needed +1  History of Present Illness 85 yo female s/t L TKA. PMH: R TKA,HTN, neuropathy  Subjective Data  Patient Stated Goal to go to rehab  Precautions  Precautions Fall;Knee  Restrictions  Weight Bearing Restrictions No  Other Position/Activity Restrictions WBAT  Pain Assessment  Pain Assessment 0-10  Pain Score 2  Pain Location L knee  Pain Descriptors / Indicators Sore;Discomfort  Pain Intervention(s) Limited activity within patient's tolerance;Monitored during session;Repositioned  Cognition  Arousal/Alertness Awake/alert  Behavior During Therapy WFL for tasks assessed/performed  Overall Cognitive Status Within Functional Limits for tasks assessed  Bed Mobility  Overal bed mobility Needs Assistance  Bed Mobility Sit to Supine  Sit to supine Min guard  General bed mobility comments for safety  Transfers  Overall transfer level Needs assistance  Equipment used Rolling walker (2 wheeled)  Transfers Sit to/from Stand  Sit to Stand Min guard  General transfer comment min/guard assist to rise and transition to RW, cues for hand placement  Ambulation/Gait  Ambulation/Gait assistance Supervision;Min guard  Gait Distance (Feet)  (hallway ambulation to tolerance)  Assistive device Rolling walker (2 wheeled)  Gait Pattern/deviations Step-to pattern;Decreased weight shift to left  General Gait Details cues for sequence and RW position  Total Joint Exercises  Ankle Circles/Pumps AROM;Both;10 reps  Straight Leg Raises AROM;Strengthening;Left;10 reps  PT - End of Session  Equipment Utilized During Treatment Gait belt  Activity Tolerance Patient tolerated treatment well  Patient left with call bell/phone within reach;in bed;with bed alarm set   PT - Assessment/Plan  PT Plan Current plan  remains appropriate  PT Visit Diagnosis Difficulty in walking, not elsewhere classified (R26.2)  PT Frequency (ACUTE ONLY) 7X/week  Follow Up Recommendations SNF  PT equipment None recommended by PT  AM-PAC PT "6 Clicks" Mobility Outcome Measure (Version 2)  Help needed turning from your back to your side while in a flat bed without using bedrails? 3  Help needed moving from lying on your back to sitting on the side of a flat bed without using bedrails? 3  Help needed moving to and from a bed to a chair (including a wheelchair)? 3  Help needed standing up from a chair using your arms (e.g., wheelchair or bedside chair)? 3  Help needed to walk in hospital room? 3  Help needed climbing 3-5 steps with a railing?  3  6 Click Score 18  Consider Recommendation of Discharge To: Home with [facility]  PT Goal Progression  Progress towards PT goals Progressing toward goals  Acute Rehab PT Goals  PT Goal Formulation With patient  Time For Goal Achievement 10/20/18  Potential to Achieve Goals Good  PT Time Calculation  PT Start Time (ACUTE ONLY) 1617  PT Stop Time (ACUTE ONLY) 1630  PT Time Calculation (min) (ACUTE ONLY) 13 min  PT General Charges  $$ ACUTE PT VISIT 1 Visit  PT Treatments  $Gait Training 8-22 mins  Kenyon Ana, PT  Pager: [phone] Acute Rehab Dept [facility]): [phone]   10/14/2018

## 2018-10-14 NOTE — Progress Notes (Signed)
Physical Therapy Treatment Patient Details Name: Patricia Mason MRN: [id number] DOB: [birth year] Today's Date: 10/14/2018    History of Present Illness 85 yo female s/t L TKA. PMH: R TKA,HTN, neuropathy    PT Comments    Pt progressing well. Will  Benefit from SNF   Follow Up Recommendations  SNF     Equipment Recommendations  None recommended by PT    Recommendations for Other Services       Precautions / Restrictions Precautions Precautions: Fall;Knee Restrictions Weight Bearing Restrictions: No Other Position/Activity Restrictions: WBAT    Mobility  Bed Mobility Overal bed mobility: Needs Assistance Bed Mobility: Supine to Sit     Supine to sit: Supervision     General bed mobility comments: for safety  Transfers Overall transfer level: Needs assistance Equipment used: Rolling walker (2 wheeled) Transfers: Sit to/from Stand Sit to Stand: Min guard         General transfer comment: min/guard assist to rise and transition to RW, cues for hand placement  Ambulation/Gait Ambulation/Gait assistance: Supervision;Min guard Gait Distance (Feet): 90'  Assistive device: Rolling walker (2 wheeled) Gait Pattern/deviations: Step-to pattern;Decreased weight shift to left     General Gait Details: cues for sequence and RW position   Stairs             Wheelchair Mobility    Modified Rankin (Stroke Patients Only)       Balance                                            Cognition Arousal/Alertness: Awake/alert Behavior During Therapy: WFL for tasks assessed/performed Overall Cognitive Status: Within Functional Limits for tasks assessed                                        Exercises Total Joint Exercises Ankle Circles/Pumps: AROM;Both;10 reps Quad Sets: 10 reps;Both;AROM Heel Slides: AROM;Left;10 reps    General Comments        Pertinent Vitals/Pain Pain Assessment: 0-10 Pain Score: 5  Pain  Location: L knee Pain Descriptors / Indicators: Sore Pain Intervention(s): Limited activity within patient's tolerance;Monitored during session;Premedicated before session;Repositioned;Ice applied    Home Living                      Prior Function            PT Goals (current goals can now be found in the care plan section) Acute Rehab PT Goals Patient Stated Goal: to go to rehab PT Goal Formulation: With patient Time For Goal Achievement: 10/20/18 Potential to Achieve Goals: Good Progress towards PT goals: Progressing toward goals    Frequency    7X/week      PT Plan Current plan remains appropriate    Co-evaluation              AM-PAC PT "6 Clicks" Mobility   Outcome Measure  Help needed turning from your back to your side while in a flat bed without using bedrails?: A Little Help needed moving from lying on your back to sitting on the side of a flat bed without using bedrails?: A Little Help needed moving to and from a bed to a chair (including a wheelchair)?: A Little Help needed standing up from a chair  using your arms (e.g., wheelchair or bedside chair)?: A Little Help needed to walk in hospital room?: A Little Help needed climbing 3-5 steps with a railing? : A Lot 6 Click Score: 17    End of Session Equipment Utilized During Treatment: Gait belt Activity Tolerance: Patient tolerated treatment well Patient left: in chair;with call bell/phone within reach;with chair alarm set   PT Visit Diagnosis: Difficulty in walking, not elsewhere classified (R26.2)     Time: 0156-1537 PT Time Calculation (min) (ACUTE ONLY): 30 min  Charges:  $Gait Training: 8-22 mins $Therapeutic Exercise: 8-22 mins                     Kenyon Ana, PT  Pager: [phone] Acute Rehab Dept [facility]): [phone]   10/14/2018    [facility] 10/14/2018, 2:16 PM

## 2018-10-14 NOTE — NC FL2 (Signed)
[place] LEVEL OF CARE SCREENING TOOL     IDENTIFICATION  Patient Name: Patricia Mason Birthdate: Feb 07, 1931 Sex: female Admission Date (Current Location): 10/13/2018  [facility] and [place] Number:  Herbalist and Address:  [facility],  [place] [address] [place]      Provider Number: [id number]  Attending Physician Name and Address:  Vickey Huger, MD  Relative Name and Phone Number:    Katerra, [place], [phone]      Current Level of Care: Hospital Recommended Level of Care: [place] Prior Approval Number:    Date Approved/Denied:   PASRR Number: [id number]  Discharge Plan: SNF    Current Diagnoses: Patient Active Problem List   Diagnosis Date Noted  . Sensory neuropathy 09/15/2018  . Paresthesias 09/15/2018  . S/P total knee replacement 12/09/2017    Orientation RESPIRATION BLADDER Height & Weight     Self, Time, Situation, Place  Normal Continent Weight: 151 lb 14.4 oz (68.9 kg) Height:  5\' 6"  (167.6 cm)  BEHAVIORAL SYMPTOMS/MOOD NEUROLOGICAL BOWEL NUTRITION STATUS      Continent Diet(Low Sodium Heart Healthy)  AMBULATORY STATUS COMMUNICATION OF NEEDS Skin   Limited Assist   Surgical wounds                       Personal Care Assistance Level of Assistance  Bathing, Feeding, Dressing Bathing Assistance: Limited assistance Feeding assistance: Independent Dressing Assistance: Limited assistance     Functional Limitations Info  Sight, Hearing, Speech Sight Info: Adequate Hearing Info: Adequate Speech Info: Adequate    SPECIAL CARE FACTORS FREQUENCY  PT (By licensed PT), OT (By licensed OT)     PT Frequency: 5x/week OT Frequency: 5x/week            Contractures Contractures Info: Not present    Additional Factors Info  Code Status, Allergies, Psychotropic Code Status Info: Fullcode Allergies Info: Allergies: Prednisone, Sulfa Antibiotics Psychotropic Info:  Tramadol         Current Medications (10/14/2018):  This is the current hospital active medication list Current Facility-Administered Medications  Medication Dose Route Frequency Provider Last Rate Last Dose  . 0.9 %  sodium chloride infusion   Intravenous Continuous Donia Ast, [place] 75 mL/hr at 10/14/18 1000    . alum & mag hydroxide-simeth (MAALOX/MYLANTA) 200-200-20 MG/5ML suspension 30 mL  30 mL Oral Q4H PRN Donia Ast, [place]      . aspirin EC tablet 325 mg  325 mg Oral BID Donia Ast, [place]   325 mg at 10/14/18 0841  . bisacodyl (DULCOLAX) EC tablet 5 mg  5 mg Oral Daily PRN Donia Ast, [place]   5 mg at 10/13/18 1812  . diphenhydrAMINE (BENADRYL) 12.5 MG/5ML elixir 12.5-25 mg  12.5-25 mg Oral Q4H PRN Donia Ast, [place]      . docusate sodium (COLACE) capsule 100 mg  100 mg Oral BID Donia Ast, [place]   100 mg at 10/14/18 9169  . ferrous sulfate tablet 325 mg  325 mg Oral TID PC Donia Ast, [place]   325 mg at 10/14/18 0841  . gabapentin (NEURONTIN) capsule 300 mg  300 mg Oral TID Donia Ast, [place]   300 mg at 10/14/18 0841  . HYDROmorphone (DILAUDID) injection 0.5-1 mg  0.5-1 mg Intravenous Q4H PRN Donia Ast, [place]      . levothyroxine (SYNTHROID) tablet 88 mcg  88 mcg Oral QAC  breakfast Donia Ast, [place]   88 mcg at 10/14/18 0516  . menthol-cetylpyridinium (CEPACOL) lozenge 3 mg  1 lozenge Oral PRN Donia Ast, PA       Or  . phenol (CHLORASEPTIC) mouth spray 1 spray  1 spray Mouth/Throat PRN Donia Ast, [place]      . methocarbamol (ROBAXIN) tablet 500 mg  500 mg Oral Q6H PRN Donia Ast, [place]   500 mg at 10/13/18 2215   Or  . methocarbamol (ROBAXIN) 500 mg in dextrose 5 % 50 mL IVPB  500 mg Intravenous Q6H PRN Donia Ast, PA      . metoCLOPramide (REGLAN) tablet 5-10 mg  5-10 mg Oral Q8H PRN Donia Ast, [place]       Or  . metoCLOPramide (REGLAN) injection 5-10 mg  5-10 mg Intravenous Q8H PRN  Donia Ast, [place]      . ondansetron [facility]) tablet 4 mg  4 mg Oral Q6H PRN Donia Ast, [place]       Or  . ondansetron [facility]) injection 4 mg  4 mg Intravenous Q6H PRN Donia Ast, [place]      . oxyCODONE (Oxy IR/ROXICODONE) immediate release tablet 5-10 mg  5-10 mg Oral Q4H PRN Donia Ast, [place]      . pantoprazole (PROTONIX) EC tablet 40 mg  40 mg Oral Daily Donia Ast, [place]   40 mg at 10/14/18 1610  . senna-docusate (Senokot-S) tablet 1 tablet  1 tablet Oral QHS PRN Donia Ast, [place]      . sodium phosphate (FLEET) 7-19 GM/118ML enema 1 enema  1 enema Rectal Once PRN Donia Ast, PA      . traMADol Veatrice Bourbon) tablet 50 mg  50 mg Oral Q6H Carlyon Shadow [place], [place]   50 mg at 10/14/18 0516  . triamterene-hydrochlorothiazide (MAXZIDE-25) 37.5-25 MG per tablet 1 tablet  1 tablet Oral Daily Donia Ast, [place]   1 tablet at 10/14/18 9604  . zolpidem (AMBIEN) tablet 5 mg  5 mg Oral QHS PRN Donia Ast, [place]         Discharge Medications: Please see discharge summary for a list of discharge medications.  Relevant Imaging Results:  Relevant Lab Results:   Additional Information VWU:[id number]  Lia Hopping, LCSW

## 2018-10-14 NOTE — TOC Progression Note (Signed)
Transition of Care [facility]) - Progression Note    Patient Details  Name: Patricia Mason MRN: [id number] Date of Birth: 10/21/1930  Transition of Care [facility]) CM/SW [place], LCSW Phone Number: 10/14/2018, 1:56 PM  Clinical Narrative:    Patient discharging to SNF for rehab. Patient chose [facility].  CSW initiated HTA authorization (patient approved for 7days [phone])   Expected Discharge Plan: [place] Barriers to Discharge: (Pending COVID-19 test results)  Expected Discharge Plan and Services Expected Discharge Plan: [place] In-house Referral: Clinical Social Work   Post Acute Care Choice: [place] Living arrangements for the past 2 months: Single Family Home Expected Discharge Date: 10/14/18               DME Arranged: 3-N-1                     Social Determinants of Health (SDOH) Interventions    Readmission Risk Interventions No flowsheet data found.

## 2018-10-15 ENCOUNTER — Encounter (HOSPITAL_COMMUNITY): Payer: Self-pay | Admitting: Orthopedic Surgery

## 2018-10-15 DIAGNOSIS — R112 Nausea with vomiting, unspecified: Secondary | ICD-10-CM | POA: Diagnosis not present

## 2018-10-15 DIAGNOSIS — Z96652 Presence of left artificial knee joint: Secondary | ICD-10-CM | POA: Diagnosis not present

## 2018-10-15 DIAGNOSIS — M810 Age-related osteoporosis without current pathological fracture: Secondary | ICD-10-CM | POA: Diagnosis not present

## 2018-10-15 DIAGNOSIS — I10 Essential (primary) hypertension: Secondary | ICD-10-CM | POA: Diagnosis not present

## 2018-10-15 DIAGNOSIS — M6259 Muscle wasting and atrophy, not elsewhere classified, multiple sites: Secondary | ICD-10-CM | POA: Diagnosis not present

## 2018-10-15 DIAGNOSIS — D649 Anemia, unspecified: Secondary | ICD-10-CM | POA: Diagnosis not present

## 2018-10-15 DIAGNOSIS — R2689 Other abnormalities of gait and mobility: Secondary | ICD-10-CM | POA: Diagnosis not present

## 2018-10-15 DIAGNOSIS — G8918 Other acute postprocedural pain: Secondary | ICD-10-CM | POA: Diagnosis not present

## 2018-10-15 DIAGNOSIS — E039 Hypothyroidism, unspecified: Secondary | ICD-10-CM | POA: Diagnosis not present

## 2018-10-15 DIAGNOSIS — Z20828 Contact with and (suspected) exposure to other viral communicable diseases: Secondary | ICD-10-CM | POA: Diagnosis not present

## 2018-10-15 DIAGNOSIS — M1712 Unilateral primary osteoarthritis, left knee: Secondary | ICD-10-CM | POA: Diagnosis not present

## 2018-10-15 DIAGNOSIS — Z471 Aftercare following joint replacement surgery: Secondary | ICD-10-CM | POA: Diagnosis not present

## 2018-10-15 DIAGNOSIS — M1990 Unspecified osteoarthritis, unspecified site: Secondary | ICD-10-CM | POA: Diagnosis not present

## 2018-10-15 DIAGNOSIS — M6289 Other specified disorders of muscle: Secondary | ICD-10-CM | POA: Diagnosis not present

## 2018-10-15 DIAGNOSIS — C4492 Squamous cell carcinoma of skin, unspecified: Secondary | ICD-10-CM | POA: Diagnosis not present

## 2018-10-15 DIAGNOSIS — E785 Hyperlipidemia, unspecified: Secondary | ICD-10-CM | POA: Diagnosis not present

## 2018-10-15 DIAGNOSIS — E0590 Thyrotoxicosis, unspecified without thyrotoxic crisis or storm: Secondary | ICD-10-CM | POA: Diagnosis not present

## 2018-10-15 DIAGNOSIS — C609 Malignant neoplasm of penis, unspecified: Secondary | ICD-10-CM | POA: Diagnosis not present

## 2018-10-15 DIAGNOSIS — G609 Hereditary and idiopathic neuropathy, unspecified: Secondary | ICD-10-CM | POA: Diagnosis not present

## 2018-10-15 DIAGNOSIS — R262 Difficulty in walking, not elsewhere classified: Secondary | ICD-10-CM | POA: Diagnosis not present

## 2018-10-15 LAB — CBC
HCT: 33.7 % — ABNORMAL LOW (ref 36.0–46.0)
Hemoglobin: 10.2 g/dL — ABNORMAL LOW (ref 12.0–15.0)
MCH: 30.4 pg (ref 26.0–34.0)
MCHC: 30.3 g/dL (ref 30.0–36.0)
MCV: 100.6 fL — ABNORMAL HIGH (ref 80.0–100.0)
Platelets: 193 10*3/uL (ref 150–400)
RBC: 3.35 MIL/uL — ABNORMAL LOW (ref 3.87–5.11)
RDW: 13.2 % (ref 11.5–15.5)
WBC: 7.3 10*3/uL (ref 4.0–10.5)
nRBC: 0 % (ref 0.0–0.2)

## 2018-10-15 MED ORDER — ASPIRIN 325 MG PO TBEC: 325.0000 mg | DELAYED_RELEASE_TABLET | Freq: Two times a day (BID) | ORAL | 0 refills | Status: AC

## 2018-10-15 MED ORDER — METHOCARBAMOL 500 MG PO TABS: 500.0000 mg | ORAL_TABLET | Freq: Four times a day (QID) | ORAL | 0 refills | Status: DC | PRN

## 2018-10-15 MED ORDER — OXYCODONE HCL 5 MG PO TABS: 5.0000 mg | ORAL_TABLET | Freq: Four times a day (QID) | ORAL | 0 refills | Status: DC | PRN

## 2018-10-15 NOTE — Anesthesia Procedure Notes (Signed)
Spinal  Patient location during procedure: OR Start time: 10/15/2018 9:30 AM End time: 10/15/2018 9:40 AM Staffing Anesthesiologist: Roberts Gaudy, MD Performed: anesthesiologist  Preanesthetic Checklist Completed: patient identified, site marked, surgical consent, pre-op evaluation, timeout performed, IV checked, risks and benefits discussed and monitors and equipment checked Spinal Block Patient position: sitting Prep: DuraPrep Patient monitoring: heart rate, cardiac monitor, continuous pulse ox and blood pressure Approach: midline Location: L3-4 Injection technique: single-shot Needle Needle type: Tuohy  Needle gauge: 22 G Needle length: 9 cm Assessment Sensory level: T4 Additional Notes Clear CSF flow 1.6 mg 0.75% Bupivacaine injected easily

## 2018-10-15 NOTE — Addendum Note (Signed)
Addendum  created 10/15/18 1535 by Roberts Gaudy, MD   Child order released for a procedure order, Clinical Note Signed, Intraprocedure Blocks edited

## 2018-10-15 NOTE — Progress Notes (Signed)
SPORTS MEDICINE AND JOINT REPLACEMENT  [name], MD   Carlynn Spry, PA-C [address], [place], [place]  [address]                             [phone]   PROGRESS NOTE  Subjective:  negative for Chest Pain  negative for Shortness of Breath  negative for Nausea/Vomiting   negative for Calf Pain  negative for Bowel Movement   Tolerating Diet: yes         Patient reports pain as 3 on 0-10 scale.    Objective: Vital signs in last 24 hours:    Patient Vitals for the past 24 hrs:  BP Temp Temp src Pulse Resp SpO2  10/15/18 0712 137/69 97.7 F (36.5 C) Oral 73 20 99 %  10/14/18 2124 (!) 143/63 98.6 F (37 C) Oral 75 20 98 %  10/14/18 1445 (!) 129/56 97.8 F (36.6 C) Oral 69 16 98 %  10/14/18 0947 (!) 130/58 97.7 F (36.5 C) Oral 66 16 99 %    @flow {1959:LAST@   Intake/Output from previous day:   07/28 0701 - 07/29 0700 In: 1197.1 [P.O.:720; I.V.:477.1] Out: 1600 [Urine:1600]   Intake/Output this shift:   07/29 0701 - 07/29 1900 In: -  Out: 400 [Urine:400]   Intake/Output      07/28 0701 - 07/29 0700 07/29 0701 - 07/30 0700   P.O. 720    I.V. (mL/kg) 477.1 (6.9)    IV Piggyback     Total Intake(mL/kg) 1197.1 (17.4)    Urine (mL/kg/hr) 1600 (1) 400 (2.4)   Stool 0    Blood     Total Output 1600 400   Net -403 -400        Urine Occurrence 1 x    Stool Occurrence 0 x       LABORATORY DATA: Recent Labs    10/10/18 1439 10/14/18 0256 10/15/18 0306  WBC 5.9 6.5 7.3  HGB 12.2 10.3* 10.2*  HCT 38.7 33.7* 33.7*  PLT 226 205 193   Recent Labs    10/10/18 1439 10/14/18 0256  NA 141 138  K 4.1 4.0  CL 103 104  CO2 29 26  BUN 14 13  CREATININE 0.82 0.95  GLUCOSE 108* 114*  CALCIUM 8.7* 7.8*   No results found for: INR, PROTIME  Examination:  General appearance: alert, cooperative and no distress Extremities: extremities normal, atraumatic, no cyanosis or edema  Wound Exam: clean, dry, intact   Drainage:  None: wound tissue  dry  Motor Exam: Quadriceps and Hamstrings Intact  Sensory Exam: Superficial Peroneal, Deep Peroneal and Tibial normal   Assessment:    2 Days Post-Op  Procedure(s) (LRB): TOTAL KNEE ARTHROPLASTY (Left)  ADDITIONAL DIAGNOSIS:  Active Problems:   S/P total knee replacement     Plan: Physical Therapy as ordered Weight Bearing as Tolerated (WBAT)  DVT Prophylaxis:  Aspirin  DISCHARGE PLAN: Skilled Nursing Facility/Rehab  Patient doing well, D/C SNF today         Jacky Hartung,STEPHEN D 10/15/2018, 9:26 AM

## 2018-10-15 NOTE — Plan of Care (Signed)
  Problem: Education: Goal: Knowledge of the prescribed therapeutic regimen will improve Outcome: Progressing   Problem: Activity: Goal: Ability to avoid complications of mobility impairment will improve Outcome: Progressing Goal: Range of joint motion will improve Outcome: Progressing   Problem: Clinical Measurements: Goal: Postoperative complications will be avoided or minimized Outcome: Progressing   Problem: Pain Management: Goal: Pain level will decrease with appropriate interventions Outcome: Progressing   Problem: Skin Integrity: Goal: Will show signs of wound healing Outcome: Progressing   Problem: Education: Goal: Knowledge of General Education information will improve Description: Including pain rating scale, medication(s)/side effects and non-pharmacologic comfort measures Outcome: Progressing   Problem: Health Behavior/Discharge Planning: Goal: Ability to manage health-related needs will improve Outcome: Progressing   Problem: Clinical Measurements: Goal: Ability to maintain clinical measurements within normal limits will improve Outcome: Progressing Goal: Will remain free from infection Outcome: Progressing Goal: Diagnostic test results will improve Outcome: Progressing Goal: Respiratory complications will improve Outcome: Progressing Goal: Cardiovascular complication will be avoided Outcome: Progressing   Problem: Activity: Goal: Risk for activity intolerance will decrease Outcome: Progressing   Problem: Nutrition: Goal: Adequate nutrition will be maintained Outcome: Progressing   Problem: Coping: Goal: Level of anxiety will decrease Outcome: Progressing   Problem: Elimination: Goal: Will not experience complications related to bowel motility Outcome: Progressing Goal: Will not experience complications related to urinary retention Outcome: Progressing   Problem: Elimination: Goal: Will not experience complications related to urinary  retention Outcome: Progressing   Problem: Pain Managment: Goal: General experience of comfort will improve Outcome: Progressing   Problem: Safety: Goal: Ability to remain free from injury will improve Outcome: Progressing   Problem: Skin Integrity: Goal: Risk for impaired skin integrity will decrease Outcome: Progressing

## 2018-10-15 NOTE — TOC Transition Note (Signed)
Transition of Care [facility]) - CM/SW Discharge Note   Patient Details  Name: Patricia Mason MRN: [id number] Date of Birth: 01/24/1931  Transition of Care [facility]) CM/SW Contact:  Lia Hopping, [place] Phone Number: 10/15/2018, 10:11 AM   Clinical Narrative:    Patient accepted to [facility] Nurse call report to: [phone] ext. [phone] Son will transport.  COVID-19 test Negative, results sent. D/C summary sent.    Final next level of care: Skilled Nursing Facility Barriers to Discharge: (Pending COVID-19 test results)   Patient Goals and CMS Choice   CMS Medicare.gov Compare Post Acute Care list provided to:: Patient Choice offered to / list presented to : Patient  Discharge Placement   Existing PASRR number confirmed : 10/14/18          Patient chooses bed at: [facility], [place] Patient to be transferred to facility by: Son will transport Name of family member notified: Son Patient and family notified of of transfer: 10/15/18  Discharge Plan and Services In-house Referral: Clinical Social Work   Post Acute Care Choice: [facility]          DME Arranged: 3-N-1                    Social Determinants of Health (SDOH) Interventions     Readmission Risk Interventions No flowsheet data found.

## 2018-10-15 NOTE — Discharge Summary (Signed)
SPORTS MEDICINE & JOINT REPLACEMENT   Patricia Mulch, MD   Patricia Shadow, PA-C Patricia Mason, [place], [place]  [address]                             [phone]  PATIENT ID: Patricia Mason        MRN:  [id number]          DOB/AGE: 1930/10/17 / 85 y.o.    DISCHARGE SUMMARY  ADMISSION DATE:    10/13/2018 DISCHARGE DATE:   10/15/2018   ADMISSION DIAGNOSIS: LEFT KNE OSTEOARTHRITIS    DISCHARGE DIAGNOSIS:  LEFT KNEE OSTEOARTHRITIS    ADDITIONAL DIAGNOSIS: Active Problems:   S/P total knee replacement  Past Medical History:  Diagnosis Date  . Arthritis   . Cancer (HCC)    Skin-Squamous cell  . Complication of anesthesia   . Hypertension   . Hyperthyroidism   . Hypothyroidism   . Iron deficiency anemia   . Neuropathy   . PONV (postoperative nausea and vomiting)   . Vitamin B 12 deficiency     PROCEDURE: Procedure(s): TOTAL KNEE ARTHROPLASTY on 10/13/2018  CONSULTS:    HISTORY:  See H&P in chart  HOSPITAL COURSE:  Patricia Mason is a 85 y.o. admitted on 10/13/2018 and found to have a diagnosis of [place].  After appropriate laboratory studies were obtained  they were taken to the operating room on 10/13/2018 and underwent Procedure(s): TOTAL KNEE ARTHROPLASTY.   They were given perioperative antibiotics:  Anti-infectives (From admission, onward)   Start     Dose/Rate Route Frequency Ordered Stop   10/13/18 1600  ceFAZolin (ANCEF) IVPB 2g/100 mL premix     2 g 200 mL/hr over 30 Minutes Intravenous Every 6 hours 10/13/18 1225 10/13/18 2246   10/13/18 0730  ceFAZolin (ANCEF) IVPB 2g/100 mL premix     2 g 200 mL/hr over 30 Minutes Intravenous On call to O.R. 10/13/18 4742 10/13/18 0929    .  Patient given tranexamic acid IV or topical and exparel intra-operatively.  Tolerated the procedure well.    POD# 1: Vital signs were stable.  Patient denied Chest pain, shortness of breath, or calf pain.  Patient was started on Aspirin twice daily at 8am.   Consults to PT, OT, and care management were made.  The patient was weight bearing as tolerated.  CPM was placed on the operative leg 0-90 degrees for 6-8 hours a day. When out of the CPM, patient was placed in the foam block to achieve full extension. Incentive spirometry was taught.  Dressing was changed.       POD #2, Continued  PT for ambulation and exercise program.  IV saline locked.  O2 discontinued.    The remainder of the hospital course was dedicated to ambulation and strengthening.   The patient was discharged on 2 Days Post-Op in  Good condition.  Blood products given:none  DIAGNOSTIC STUDIES: Recent vital signs:  Patient Vitals for the past 24 hrs:  BP Temp Temp src Pulse Resp SpO2  10/15/18 0712 137/69 97.7 F (36.5 C) Oral 73 20 99 %  10/14/18 2124 (!) 143/63 98.6 F (37 C) Oral 75 20 98 %  10/14/18 1445 (!) 129/56 97.8 F (36.6 C) Oral 69 16 98 %  10/14/18 0947 (!) 130/58 97.7 F (36.5 C) Oral 66 16 99 %       Recent laboratory studies: Recent Labs  10/10/18 1439 10/14/18 0256 10/15/18 0306  WBC 5.9 6.5 7.3  HGB 12.2 10.3* 10.2*  HCT 38.7 33.7* 33.7*  PLT 226 205 193   Recent Labs    10/10/18 1439 10/14/18 0256  NA 141 138  K 4.1 4.0  CL 103 104  CO2 29 26  BUN 14 13  CREATININE 0.82 0.95  GLUCOSE 108* 114*  CALCIUM 8.7* 7.8*   No results found for: INR, PROTIME   Recent Radiographic Studies :  No results found.  DISCHARGE INSTRUCTIONS: Discharge Instructions    Call MD / Call 911   Complete by: As directed    If you experience chest pain or shortness of breath, CALL 911 and be transported to the hospital emergency room.  If you develope a fever above 101 F, pus (white drainage) or increased drainage or redness at the wound, or calf pain, call your surgeon's office.   Constipation Prevention   Complete by: As directed    Drink plenty of fluids.  Prune juice may be helpful.  You may use a stool softener, such as Colace (over the counter)  100 mg twice a day.  Use MiraLax (over the counter) for constipation as needed.   Diet - low sodium heart healthy   Complete by: As directed    Discharge instructions   Complete by: As directed    INSTRUCTIONS AFTER JOINT REPLACEMENT   Remove items at home which could result in a fall. This includes throw rugs or furniture in walking pathways ICE to the affected joint every three hours while awake for 30 minutes at a time, for at least the first 3-5 days, and then as needed for pain and swelling.  Continue to use ice for pain and swelling. You may notice swelling that will progress down to the foot and ankle.  This is normal after surgery.  Elevate your leg when you are not up walking on it.   Continue to use the breathing machine you got in the hospital (incentive spirometer) which will help keep your temperature down.  It is common for your temperature to cycle up and down following surgery, especially at night when you are not up moving around and exerting yourself.  The breathing machine keeps your lungs expanded and your temperature down.   DIET:  As you were doing prior to hospitalization, we recommend a well-balanced diet.  DRESSING / WOUND CARE / SHOWERING  Keep the surgical dressing until follow up.  The dressing is water proof, so you can shower without any extra covering.  IF THE DRESSING FALLS OFF or the wound gets wet inside, change the dressing with sterile gauze.  Please use good hand washing techniques before changing the dressing.  Do not use any lotions or creams on the incision until instructed by your surgeon.    ACTIVITY  Increase activity slowly as tolerated, but follow the weight bearing instructions below.   No driving for 6 weeks or until further direction given by your physician.  You cannot drive while taking narcotics.  No lifting or carrying greater than 10 lbs. until further directed by your surgeon. Avoid periods of inactivity such as sitting longer than an hour  when not asleep. This helps prevent blood clots.  You may return to work once you are authorized by your doctor.     WEIGHT BEARING   Weight bearing as tolerated with assist device (walker, cane, etc) as directed, use it as long as suggested by your surgeon or therapist,  typically at least 4-6 weeks.   EXERCISES  Results after joint replacement surgery are often greatly improved when you follow the exercise, range of motion and muscle strengthening exercises prescribed by your doctor. Safety measures are also important to protect the joint from further injury. Any time any of these exercises cause you to have increased pain or swelling, decrease what you are doing until you are comfortable again and then slowly increase them. If you have problems or questions, call your caregiver or physical therapist for advice.   Rehabilitation is important following a joint replacement. After just a few days of immobilization, the muscles of the leg can become weakened and shrink (atrophy).  These exercises are designed to build up the tone and strength of the thigh and leg muscles and to improve motion. Often times heat used for twenty to thirty minutes before working out will loosen up your tissues and help with improving the range of motion but do not use heat for the first two weeks following surgery (sometimes heat can increase post-operative swelling).   These exercises can be done on a training (exercise) mat, on the floor, on a table or on a bed. Use whatever works the best and is most comfortable for you.    Use music or television while you are exercising so that the exercises are a pleasant break in your day. This will make your life better with the exercises acting as a break in your routine that you can look forward to.   Perform all exercises about fifteen times, three times per day or as directed.  You should exercise both the operative leg and the other leg as well.   Exercises include:   Quad  Sets - Tighten up the muscle on the front of the thigh (Quad) and hold for 5-10 seconds.   Straight Leg Raises - With your knee straight (if you were given a brace, keep it on), lift the leg to 60 degrees, hold for 3 seconds, and slowly lower the leg.  Perform this exercise against resistance later as your leg gets stronger.  Leg Slides: Lying on your back, slowly slide your foot toward your buttocks, bending your knee up off the floor (only go as far as is comfortable). Then slowly slide your foot back down until your leg is flat on the floor again.  Angel Wings: Lying on your back spread your legs to the side as far apart as you can without causing discomfort.  Hamstring Strength:  Lying on your back, push your heel against the floor with your leg straight by tightening up the muscles of your buttocks.  Repeat, but this time bend your knee to a comfortable angle, and push your heel against the floor.  You may put a pillow under the heel to make it more comfortable if necessary.   A rehabilitation program following joint replacement surgery can speed recovery and prevent re-injury in the future due to weakened muscles. Contact your doctor or a physical therapist for more information on knee rehabilitation.    CONSTIPATION  Constipation is defined medically as fewer than three stools per week and severe constipation as less than one stool per week.  Even if you have a regular bowel pattern at home, your normal regimen is likely to be disrupted due to multiple reasons following surgery.  Combination of anesthesia, postoperative narcotics, change in appetite and fluid intake all can affect your bowels.   YOU MUST use at least one of the following  options; they are listed in order of increasing strength to get the job done.  They are all available over the counter, and you may need to use some, POSSIBLY even all of these options:    Drink plenty of fluids (prune juice may be helpful) and high fiber  foods Colace 100 mg by mouth twice a day  Senokot for constipation as directed and as needed Dulcolax (bisacodyl), take with full glass of water  Miralax (polyethylene glycol) once or twice a day as needed.  If you have tried all these things and are unable to have a bowel movement in the first 3-4 days after surgery call either your surgeon or your primary doctor.    If you experience loose stools or diarrhea, hold the medications until you stool forms back up.  If your symptoms do not get better within 1 week or if they get worse, check with your doctor.  If you experience "the worst abdominal pain ever" or develop nausea or vomiting, please contact the office immediately for further recommendations for treatment.   ITCHING:  If you experience itching with your medications, try taking only a single pain pill, or even half a pain pill at a time.  You can also use Benadryl over the counter for itching or also to help with sleep.   TED HOSE STOCKINGS:  Use stockings on both legs until for at least 2 weeks or as directed by physician office. They may be removed at night for sleeping.  MEDICATIONS:  See your medication summary on the "After Visit Summary" that nursing will review with you.  You may have some home medications which will be placed on hold until you complete the course of blood thinner medication.  It is important for you to complete the blood thinner medication as prescribed.  PRECAUTIONS:  If you experience chest pain or shortness of breath - call 911 immediately for transfer to the hospital emergency department.   If you develop a fever greater that 101 F, purulent drainage from wound, increased redness or drainage from wound, foul odor from the wound/dressing, or calf pain - CONTACT YOUR SURGEON.                                                   FOLLOW-UP APPOINTMENTS:  If you do not already have a post-op appointment, please call the office for an appointment to be seen by your  surgeon.  Guidelines for how soon to be seen are listed in your "After Visit Summary", but are typically between 1-4 weeks after surgery.  OTHER INSTRUCTIONS:   Knee Replacement:  Do not place pillow under knee, focus on keeping the knee straight while resting. CPM instructions: 0-90 degrees, 2 hours in the morning, 2 hours in the afternoon, and 2 hours in the evening. Place foam block, curve side up under heel at all times except when in CPM or when walking.  DO NOT modify, tear, cut, or change the foam block in any way.  MAKE SURE YOU:  Understand these instructions.  Get help right away if you are not doing well or get worse.    Thank you for letting us be a part of your medical care team.  It is a privilege we respect greatly.  We hope these instructions will help you stay on track for a fast  and full recovery!   Increase activity slowly as tolerated   Complete by: As directed       DISCHARGE MEDICATIONS:   Allergies as of 10/15/2018      Reactions   Prednisone Hives   Sulfa Antibiotics Anaphylaxis, Rash   Throat swelling/fever      Medication List    TAKE these medications   aspirin 325 MG EC tablet Take 1 tablet (325 mg total) by mouth 2 (two) times daily. What changed:   medication strength  how much to take  when to take this   Biotin 5000 MCG Tabs Take 5,000 mcg by mouth daily.   CALCIUM 600+D3 PO Take 1 tablet by mouth daily.   ferrous sulfate 325 (65 FE) MG tablet Take 325 mg by mouth daily with breakfast.   fexofenadine 180 MG tablet Commonly known as: ALLEGRA Take 180 mg by mouth daily.   Fish Oil 1000 MG Caps Take 1,000 mg by mouth daily.   gabapentin 300 MG capsule Commonly known as: NEURONTIN Take 300 mg by mouth 3 (three) times daily.   levothyroxine 88 MCG tablet Commonly known as: SYNTHROID Take 88 mcg by mouth daily before breakfast.   methocarbamol 500 MG tablet Commonly known as: ROBAXIN Take 1-2 tablets (500-1,000 mg total) by  mouth every 6 (six) hours as needed for muscle spasms.   oxyCODONE 5 MG immediate release tablet Commonly known as: Oxy IR/ROXICODONE Take 1-2 tablets (5-10 mg total) by mouth every 6 (six) hours as needed for moderate pain (pain score 4-6).   topiramate 25 MG tablet Commonly known as: Topamax Take 1 tablet (25 mg total) by mouth 2 (two) times daily. Start 1 tablet daily x 7 days and then twice daily   triamterene-hydrochlorothiazide 37.5-25 MG tablet Commonly known as: MAXZIDE-25 Take 1 tablet by mouth daily.   trolamine salicylate 10 % cream Commonly known as: ASPERCREME Apply 1 application topically 2 (two) times daily as needed for muscle pain.   vitamin B-12 1000 MCG tablet Commonly known as: CYANOCOBALAMIN Take 1,000 mcg by mouth daily.            Durable Medical Equipment  (From admission, onward)         Start     Ordered   10/13/18 1225  DME Walker rolling  Once    Question:  Patient needs a walker to treat with the following condition  Answer:  S/P total knee replacement   10/13/18 1225   10/13/18 1225  DME 3 n 1  Once     10/13/18 1225   10/13/18 1225  DME Bedside commode  Once    Question:  Patient needs a bedside commode to treat with the following condition  Answer:  S/P total knee replacement   10/13/18 1225          FOLLOW UP VISIT:   Contact information for after-discharge care    Destination    [facility] .   Service: Skilled Nursing Contact information: [address] [place] [phone]              DISPOSITION: HOME VS. SNF  CONDITION:  Keenan Bachelor 10/15/2018, 9:28 AM

## 2018-10-15 NOTE — Progress Notes (Signed)
Physical Therapy Treatment Patient Details Name: Patricia Mason MRN: [id number] DOB: 22-Apr-1930 Today's Date: 10/15/2018    History of Present Illness L TKA; PMH: neuropathy, HTN, R TKA    PT Comments    Pt is progressing well with mobility. She ambulated 180' with RW, no loss of balance. Reviewed TKA HEP. She is ready to DC to SNF from PT standpoint.   Follow Up Recommendations  SNF     Equipment Recommendations  None recommended by PT    Recommendations for Other Services       Precautions / Restrictions Precautions Precautions: Fall;Knee Precaution Booklet Issued: Yes (comment) Precaution Comments: reviewed no pillow under knee Restrictions Weight Bearing Restrictions: No Other Position/Activity Restrictions: WBAT    Mobility  Bed Mobility               General bed mobility comments: up in recliner  Transfers Overall transfer level: Needs assistance Equipment used: Rolling walker (2 wheeled) Transfers: Sit to/from Stand Sit to Stand: Supervision         General transfer comment: supervision for safety  Ambulation/Gait Ambulation/Gait assistance: Supervision Gait Distance (Feet): 180 Feet Assistive device: Rolling walker (2 wheeled) Gait Pattern/deviations: Step-to pattern;Decreased weight shift to left Gait velocity: WFL   General Gait Details: cues for sequence and RW position, no LOB   Stairs             Wheelchair Mobility    Modified Rankin (Stroke Patients Only)       Balance Overall balance assessment: Modified Independent                                          Cognition Arousal/Alertness: Awake/alert Behavior During Therapy: WFL for tasks assessed/performed Overall Cognitive Status: Within Functional Limits for tasks assessed                                        Exercises Total Joint Exercises Ankle Circles/Pumps: AROM;Both;10 reps Heel Slides: AROM;Left;10  reps;Supine Straight Leg Raises: AROM;Left;10 reps;Supine    General Comments        Pertinent Vitals/Pain Pain Score: 6  Pain Location: L knee with walking Pain Descriptors / Indicators: Sore Pain Intervention(s): Limited activity within patient's tolerance;Monitored during session;Premedicated before session;Ice applied    Home Living                      Prior Function            PT Goals (current goals can now be found in the care plan section) Acute Rehab PT Goals Patient Stated Goal: to go to rehab PT Goal Formulation: With patient Time For Goal Achievement: 10/20/18 Potential to Achieve Goals: Good Progress towards PT goals: Progressing toward goals    Frequency    7X/week      PT Plan Current plan remains appropriate    Co-evaluation              AM-PAC PT "6 Clicks" Mobility   Outcome Measure  Help needed turning from your back to your side while in a flat bed without using bedrails?: A Little Help needed moving from lying on your back to sitting on the side of a flat bed without using bedrails?: A Little Help needed moving to and from  a bed to a chair (including a wheelchair)?: A Little Help needed standing up from a chair using your arms (e.g., wheelchair or bedside chair)?: A Little Help needed to walk in hospital room?: A Little Help needed climbing 3-5 steps with a railing? : A Lot 6 Click Score: 17    End of Session Equipment Utilized During Treatment: Gait belt Activity Tolerance: Patient tolerated treatment well Patient left: in chair;with call bell/phone within reach   PT Visit Diagnosis: Difficulty in walking, not elsewhere classified (R26.2)     Time: 4734-0370 PT Time Calculation (min) (ACUTE ONLY): 17 min  Charges:  $Gait Training: 8-22 mins                    Blondell Reveal Kistler PT 10/15/2018  Acute Rehabilitation Services Pager [phone] Office [phone]

## 2018-10-17 DIAGNOSIS — R262 Difficulty in walking, not elsewhere classified: Secondary | ICD-10-CM | POA: Diagnosis not present

## 2018-10-17 DIAGNOSIS — Z96652 Presence of left artificial knee joint: Secondary | ICD-10-CM | POA: Diagnosis not present

## 2018-10-17 DIAGNOSIS — D649 Anemia, unspecified: Secondary | ICD-10-CM | POA: Diagnosis not present

## 2018-10-17 DIAGNOSIS — G8918 Other acute postprocedural pain: Secondary | ICD-10-CM | POA: Diagnosis not present

## 2018-10-23 DIAGNOSIS — Z471 Aftercare following joint replacement surgery: Secondary | ICD-10-CM | POA: Diagnosis not present

## 2018-10-23 DIAGNOSIS — Z96652 Presence of left artificial knee joint: Secondary | ICD-10-CM | POA: Diagnosis not present

## 2018-10-27 DIAGNOSIS — M25562 Pain in left knee: Secondary | ICD-10-CM | POA: Diagnosis not present

## 2018-10-27 DIAGNOSIS — M62552 Muscle wasting and atrophy, not elsewhere classified, left thigh: Secondary | ICD-10-CM | POA: Diagnosis not present

## 2018-10-27 DIAGNOSIS — M25462 Effusion, left knee: Secondary | ICD-10-CM | POA: Diagnosis not present

## 2018-10-27 DIAGNOSIS — Z96652 Presence of left artificial knee joint: Secondary | ICD-10-CM | POA: Diagnosis not present

## 2018-10-27 DIAGNOSIS — R2689 Other abnormalities of gait and mobility: Secondary | ICD-10-CM | POA: Diagnosis not present

## 2018-10-31 DIAGNOSIS — M62552 Muscle wasting and atrophy, not elsewhere classified, left thigh: Secondary | ICD-10-CM | POA: Diagnosis not present

## 2018-10-31 DIAGNOSIS — Z96652 Presence of left artificial knee joint: Secondary | ICD-10-CM | POA: Diagnosis not present

## 2018-10-31 DIAGNOSIS — M25462 Effusion, left knee: Secondary | ICD-10-CM | POA: Diagnosis not present

## 2018-10-31 DIAGNOSIS — M25562 Pain in left knee: Secondary | ICD-10-CM | POA: Diagnosis not present

## 2018-10-31 DIAGNOSIS — R2689 Other abnormalities of gait and mobility: Secondary | ICD-10-CM | POA: Diagnosis not present

## 2018-11-05 DIAGNOSIS — R2689 Other abnormalities of gait and mobility: Secondary | ICD-10-CM | POA: Diagnosis not present

## 2018-11-05 DIAGNOSIS — M25462 Effusion, left knee: Secondary | ICD-10-CM | POA: Diagnosis not present

## 2018-11-05 DIAGNOSIS — Z96652 Presence of left artificial knee joint: Secondary | ICD-10-CM | POA: Diagnosis not present

## 2018-11-05 DIAGNOSIS — M25562 Pain in left knee: Secondary | ICD-10-CM | POA: Diagnosis not present

## 2018-11-05 DIAGNOSIS — M62552 Muscle wasting and atrophy, not elsewhere classified, left thigh: Secondary | ICD-10-CM | POA: Diagnosis not present

## 2018-11-07 DIAGNOSIS — R2689 Other abnormalities of gait and mobility: Secondary | ICD-10-CM | POA: Diagnosis not present

## 2018-11-07 DIAGNOSIS — M62552 Muscle wasting and atrophy, not elsewhere classified, left thigh: Secondary | ICD-10-CM | POA: Diagnosis not present

## 2018-11-07 DIAGNOSIS — M25562 Pain in left knee: Secondary | ICD-10-CM | POA: Diagnosis not present

## 2018-11-07 DIAGNOSIS — M25462 Effusion, left knee: Secondary | ICD-10-CM | POA: Diagnosis not present

## 2018-11-07 DIAGNOSIS — Z96652 Presence of left artificial knee joint: Secondary | ICD-10-CM | POA: Diagnosis not present

## 2018-11-11 DIAGNOSIS — M25562 Pain in left knee: Secondary | ICD-10-CM | POA: Diagnosis not present

## 2018-11-11 DIAGNOSIS — M25462 Effusion, left knee: Secondary | ICD-10-CM | POA: Diagnosis not present

## 2018-11-11 DIAGNOSIS — Z96652 Presence of left artificial knee joint: Secondary | ICD-10-CM | POA: Diagnosis not present

## 2018-11-11 DIAGNOSIS — M62552 Muscle wasting and atrophy, not elsewhere classified, left thigh: Secondary | ICD-10-CM | POA: Diagnosis not present

## 2018-11-11 DIAGNOSIS — R2689 Other abnormalities of gait and mobility: Secondary | ICD-10-CM | POA: Diagnosis not present

## 2018-11-13 DIAGNOSIS — M25562 Pain in left knee: Secondary | ICD-10-CM | POA: Diagnosis not present

## 2018-11-13 DIAGNOSIS — R2689 Other abnormalities of gait and mobility: Secondary | ICD-10-CM | POA: Diagnosis not present

## 2018-11-13 DIAGNOSIS — Z96652 Presence of left artificial knee joint: Secondary | ICD-10-CM | POA: Diagnosis not present

## 2018-11-13 DIAGNOSIS — M62552 Muscle wasting and atrophy, not elsewhere classified, left thigh: Secondary | ICD-10-CM | POA: Diagnosis not present

## 2018-11-13 DIAGNOSIS — M25462 Effusion, left knee: Secondary | ICD-10-CM | POA: Diagnosis not present

## 2018-11-17 DIAGNOSIS — Z96652 Presence of left artificial knee joint: Secondary | ICD-10-CM | POA: Diagnosis not present

## 2018-11-17 DIAGNOSIS — R2689 Other abnormalities of gait and mobility: Secondary | ICD-10-CM | POA: Diagnosis not present

## 2018-11-17 DIAGNOSIS — M25462 Effusion, left knee: Secondary | ICD-10-CM | POA: Diagnosis not present

## 2018-11-17 DIAGNOSIS — M62552 Muscle wasting and atrophy, not elsewhere classified, left thigh: Secondary | ICD-10-CM | POA: Diagnosis not present

## 2018-11-17 DIAGNOSIS — M25562 Pain in left knee: Secondary | ICD-10-CM | POA: Diagnosis not present

## 2018-11-19 DIAGNOSIS — Z96652 Presence of left artificial knee joint: Secondary | ICD-10-CM | POA: Diagnosis not present

## 2018-11-19 DIAGNOSIS — M25462 Effusion, left knee: Secondary | ICD-10-CM | POA: Diagnosis not present

## 2018-11-19 DIAGNOSIS — R2689 Other abnormalities of gait and mobility: Secondary | ICD-10-CM | POA: Diagnosis not present

## 2018-11-19 DIAGNOSIS — M25562 Pain in left knee: Secondary | ICD-10-CM | POA: Diagnosis not present

## 2018-11-19 DIAGNOSIS — M62552 Muscle wasting and atrophy, not elsewhere classified, left thigh: Secondary | ICD-10-CM | POA: Diagnosis not present

## 2018-11-26 DIAGNOSIS — M25562 Pain in left knee: Secondary | ICD-10-CM | POA: Diagnosis not present

## 2018-11-26 DIAGNOSIS — R2689 Other abnormalities of gait and mobility: Secondary | ICD-10-CM | POA: Diagnosis not present

## 2018-11-26 DIAGNOSIS — Z96652 Presence of left artificial knee joint: Secondary | ICD-10-CM | POA: Diagnosis not present

## 2018-11-26 DIAGNOSIS — M62552 Muscle wasting and atrophy, not elsewhere classified, left thigh: Secondary | ICD-10-CM | POA: Diagnosis not present

## 2018-11-26 DIAGNOSIS — M25462 Effusion, left knee: Secondary | ICD-10-CM | POA: Diagnosis not present

## 2018-12-03 DIAGNOSIS — R2689 Other abnormalities of gait and mobility: Secondary | ICD-10-CM | POA: Diagnosis not present

## 2018-12-03 DIAGNOSIS — M62552 Muscle wasting and atrophy, not elsewhere classified, left thigh: Secondary | ICD-10-CM | POA: Diagnosis not present

## 2018-12-03 DIAGNOSIS — M25562 Pain in left knee: Secondary | ICD-10-CM | POA: Diagnosis not present

## 2018-12-03 DIAGNOSIS — Z96652 Presence of left artificial knee joint: Secondary | ICD-10-CM | POA: Diagnosis not present

## 2018-12-03 DIAGNOSIS — M25462 Effusion, left knee: Secondary | ICD-10-CM | POA: Diagnosis not present

## 2018-12-04 ENCOUNTER — Telehealth: Payer: Self-pay | Admitting: Neurology

## 2018-12-08 NOTE — Telephone Encounter (Signed)
error 

## 2018-12-16 DIAGNOSIS — F5101 Primary insomnia: Secondary | ICD-10-CM | POA: Diagnosis not present

## 2018-12-16 DIAGNOSIS — G629 Polyneuropathy, unspecified: Secondary | ICD-10-CM | POA: Diagnosis not present

## 2018-12-16 DIAGNOSIS — I10 Essential (primary) hypertension: Secondary | ICD-10-CM | POA: Diagnosis not present

## 2018-12-16 DIAGNOSIS — E039 Hypothyroidism, unspecified: Secondary | ICD-10-CM | POA: Diagnosis not present

## 2019-01-22 DIAGNOSIS — Z96652 Presence of left artificial knee joint: Secondary | ICD-10-CM | POA: Diagnosis not present

## 2019-02-03 ENCOUNTER — Ambulatory Visit: Payer: PPO | Admitting: Neurology

## 2019-03-17 DIAGNOSIS — Z79899 Other long term (current) drug therapy: Secondary | ICD-10-CM | POA: Diagnosis not present

## 2019-03-17 DIAGNOSIS — I10 Essential (primary) hypertension: Secondary | ICD-10-CM | POA: Diagnosis not present

## 2019-03-17 DIAGNOSIS — E039 Hypothyroidism, unspecified: Secondary | ICD-10-CM | POA: Diagnosis not present

## 2019-03-17 DIAGNOSIS — F5101 Primary insomnia: Secondary | ICD-10-CM | POA: Diagnosis not present

## 2019-03-17 DIAGNOSIS — G629 Polyneuropathy, unspecified: Secondary | ICD-10-CM | POA: Diagnosis not present

## 2019-03-17 DIAGNOSIS — Z6826 Body mass index (BMI) 26.0-26.9, adult: Secondary | ICD-10-CM | POA: Diagnosis not present

## 2019-03-17 DIAGNOSIS — Z1331 Encounter for screening for depression: Secondary | ICD-10-CM | POA: Diagnosis not present

## 2019-03-31 ENCOUNTER — Ambulatory Visit: Payer: PPO | Admitting: Neurology

## 2019-03-31 ENCOUNTER — Other Ambulatory Visit: Payer: Self-pay

## 2019-03-31 ENCOUNTER — Encounter: Payer: Self-pay | Admitting: Neurology

## 2019-03-31 VITALS — BP 131/63 | HR 75 | Temp 97.3°F | Wt 152.4 lb

## 2019-03-31 DIAGNOSIS — R200 Anesthesia of skin: Secondary | ICD-10-CM

## 2019-03-31 NOTE — Progress Notes (Signed)
[facility] [address] [place]. [place] [address] [phone]       OFFICE FOLLOW UP NOTE  [name] Date of Birth:  12-Apr-1930 Medical Record Number:  [id number]   Referring MD:  Haydee Salter, NP  Reason for Referral:  Sensory neuropathy  HPI: Initial visit 09/15/18 : Patricia Mason is a 86 year old pleasant Caucasian lady seen today for initial office consultation visit for neuropathy.  History is obtained from the patient as well as referral notes and from her son was present for this visit.  She has a past medical history of hypertension, degenerative disc disease, hypothyroidism basal cell carcinoma of the nose.  States she has a 10-year history of sensory peripheral neuropathy.  She describes this as discomfort in her feet from the ankle down bilaterally.  Over the last 6 years she was being followed at Dr. Legrand Como [facility]'s neurology clinic in [place] but for the last couple of years since he left this practice she has had no neurological follow-up.  She apparently had a EMG nerve conduction study done in September 2015 which showed absent sural sensory responses but I do not have that report to review.  She states in the last 3 months or so she is noticed increased and worsening particularly in her right leg.  She describes discomfort now present throughout the day but more noticeable when she is is on her feet or at night.  At times she has trouble sleeping.  She is currently taking Neurontin 300 mg 3 times daily which previously used to work well but there is no longer keeping her discomfort under control.  She also admits to balance being slightly off however she has had no major falls.  She can get by indoors without any help but does use a cane for outdoors.  She was recently seen by Dr. Dominica Severin cram neurosurgeon who apparently did some work-up for degenerative back disease but I do not have his records for my review today.  Similarly I have not seen Dr.  Applegate's records for my review today as well.  Patient also has some numbness in her hands but they are it is not as bad.  She denies significant weakness in the hands or diminished fine motor skills.  She does not trip or fall.  She has not tried other medications besides gabapentin for neuropathic pain.  She denies any recent loss of weight, decreased appetite, fever or chronic cough.  She denies history of diabetes or heavy alcohol intake Update 03/31/2019.  Patient returns for follow-up after last visit 6 months ago.  She is accompanied by her son.  She states she was not able to tolerate Topamax which is started at last visit because of dizziness.  She was only on a small dose of 50 mg twice daily and discontinued it.  She remains on gabapentin and the current dose of 300 mg 3 times daily which is tolerating well and states she is never been on a higher dose but still complains of significant paresthesias and numbness in her feet.  This is constant and bothersome.  She had neuropathy panel labs done on 09/15/2018 all of which were normal.  I had requested the EMG nerve conduction study but for unclear reason that has not yet been scheduled.  She did have surgery for left knee replacement on 10/13/2018 which went well.  She has no new complaints today.  I am yet to receive records from Dr. Kary Kos and Dr. Donaciano Eva  office  ROS:   14 system review of systems is positive for burning, tingling, numbness, pain, leg discomfort, gait difficulty, balance difficulty, knee pain, arthritis and all other systems negative  PMH:  Past Medical History:  Diagnosis Date  . Arthritis   . Cancer (HCC)    Skin-Squamous cell  . Complication of anesthesia   . Hypertension   . Hyperthyroidism   . Hypothyroidism   . Iron deficiency anemia   . Neuropathy   . PONV (postoperative nausea and vomiting)   . Vitamin B 12 deficiency     Social History:  Social History   Socioeconomic History  . Marital status:  Widowed    Spouse name: Not on file  . Number of children: Not on file  . Years of education: Not on file  . Highest education level: Not on file  Occupational History  . Not on file  Tobacco Use  . Smoking status: Never Smoker  . Smokeless tobacco: Never Used  Substance and Sexual Activity  . Alcohol use: Never  . Drug use: Never  . Sexual activity: Not on file  Other Topics Concern  . Not on file  Social History Narrative  . Not on file   Social Determinants of Health   Financial Resource Strain:   . Difficulty of Paying Living Expenses: Not on file  Food Insecurity:   . Worried About Charity fundraiser in the Last Year: Not on file  . Ran Out of Food in the Last Year: Not on file  Transportation Needs:   . Lack of Transportation (Medical): Not on file  . Lack of Transportation (Non-Medical): Not on file  Physical Activity:   . Days of Exercise per Week: Not on file  . Minutes of Exercise per Session: Not on file  Stress:   . Feeling of Stress : Not on file  Social Connections:   . Frequency of Communication with Friends and Family: Not on file  . Frequency of Social Gatherings with Friends and Family: Not on file  . Attends Religious Services: Not on file  . Active Member of Clubs or Organizations: Not on file  . Attends Archivist Meetings: Not on file  . Marital Status: Not on file  Intimate Partner Violence:   . Fear of Current or Ex-Partner: Not on file  . Emotionally Abused: Not on file  . Physically Abused: Not on file  . Sexually Abused: Not on file    Medications:   Current Outpatient Medications on File Prior to Visit  Medication Sig Dispense Refill  . aspirin 325 MG EC tablet Take 1 tablet (325 mg total) by mouth 2 (two) times daily. 30 tablet 0  . Biotin 5000 MCG TABS Take 5,000 mcg by mouth daily.     . Calcium Carb-Cholecalciferol (CALCIUM 600+D3 PO) Take 1 tablet by mouth daily.    . ferrous sulfate 325 (65 FE) MG tablet Take 325 mg by  mouth daily with breakfast.    . fexofenadine (ALLEGRA) 180 MG tablet Take 180 mg by mouth daily.    Marland Kitchen gabapentin (NEURONTIN) 300 MG capsule Take 600 mg by mouth 3 (three) times daily.    Marland Kitchen levothyroxine (SYNTHROID, LEVOTHROID) 88 MCG tablet Take 88 mcg by mouth daily before breakfast.    . methocarbamol (ROBAXIN) 500 MG tablet Take 1-2 tablets (500-1,000 mg total) by mouth every 6 (six) hours as needed for muscle spasms. 60 tablet 0  . Omega-3 Fatty Acids (FISH OIL) 1000 MG  CAPS Take 1,000 mg by mouth daily.    . traZODone (DESYREL) 50 MG tablet Take 25-50 mg by mouth at bedtime.     . triamterene-hydrochlorothiazide (MAXZIDE-25) 37.5-25 MG tablet Take 1 tablet by mouth daily.     Marland Kitchen trolamine salicylate (ASPERCREME) 10 % cream Apply 1 application topically 2 (two) times daily as needed for muscle pain.    . vitamin B-12 (CYANOCOBALAMIN) 1000 MCG tablet Take 1,000 mcg by mouth daily.     No current facility-administered medications on file prior to visit.    Allergies:   Allergies  Allergen Reactions  . Prednisone Hives  . Sulfa Antibiotics Anaphylaxis and Rash    Throat swelling/fever    Physical Exam General: well developed, well nourished frail elderly Caucasian lady, seated, in no evident distress Head: head normocephalic and atraumatic.   Neck: supple with no carotid or supraclavicular bruits.  Surgical scar from prior thyroid surgery in the neck Cardiovascular: regular rate and rhythm, no murmurs Musculoskeletal: no deformity Skin:  no rash/petichiae Vascular:  Normal pulses all extremities  Neurologic Exam Mental Status: Awake and fully alert. Oriented to place and time. Recent and remote memory intact. Attention span, concentration and fund of knowledge appropriate. Mood and affect appropriate.  Cranial Nerves: Fundoscopic exam not done pupils equal, briskly reactive to light. Extraocular movements full without nystagmus. Visual fields full to confrontation. Hearing intact.  Facial sensation intact. Face, tongue, palate moves normally and symmetrically.  Motor: Normal bulk and tone. Normal strength in all tested extremity muscles. Sensory.: intact to touch , pinprick , position sensation but diminished vibratory sensation bilaterally from ankle down..  Romberg sign is negative Coordination: Rapid alternating movements normal in all extremities. Finger-to-nose and heel-to-shin performed accurately bilaterally. Gait and Station: Arises from chair without difficulty. Stance is normal. Gait demonstrates normal stride length and balance .  She is unable to heel, toe and tandem walk  .  Reflexes: 1+ and symmetric except both ankle jerks are absent.. Toes downgoing.     ASSESSMENT: 87 lady with chronic sensory peripheral neuropathy with recent flareup and her lower extremity paresthesias of unclear etiology.    PLAN: I had a long discussion with the patient and her son regarding paresthesias from chronic sensory neuropathy which appear quite bothersome.  She has not been able to tolerate Topamax but has tolerated a lower dose of gabapentin 300 mg 3 times daily.  I recommend she increase the dose to 600 mg 3 times daily gradually over the next to 3 weeks as tolerated.  I have updated her prescription.  Check EMG nerve conduction study.  Return for follow-up in 2 months or call earlier if necessary  Greater than 50% time during this 25-minute  visit was spent on counseling and coordination of care about her paresthesias and sensory neuropathy and discussion about evaluation and treatment and answering questions.  Antony Contras, MD  [facility] [address] [place], [place] [address]  Phone [phone] Fax [phone] Note: This document was prepared with digital dictation and possible smart phrase technology. Any transcriptional errors that result from this process are unintentional.

## 2019-03-31 NOTE — Patient Instructions (Signed)
I had a long discussion with the patient and her son regarding paresthesias from chronic sensory neuropathy which appear quite bothersome.  She has not been able to tolerate Topamax but has tolerated a lower dose of gabapentin 300 mg 3 times daily.  I recommend she increase the dose to 600 mg 3 times daily gradually over the next to 3 weeks as tolerated.  I have updated her prescription.  Check EMG nerve conduction study.  Return for follow-up in 2 months or call earlier if necessary

## 2019-04-06 ENCOUNTER — Ambulatory Visit: Payer: PPO | Admitting: Neurology

## 2019-04-06 ENCOUNTER — Ambulatory Visit (INDEPENDENT_AMBULATORY_CARE_PROVIDER_SITE_OTHER): Payer: PPO | Admitting: Neurology

## 2019-04-06 ENCOUNTER — Encounter: Payer: Self-pay | Admitting: Neurology

## 2019-04-06 ENCOUNTER — Other Ambulatory Visit: Payer: Self-pay

## 2019-04-06 DIAGNOSIS — R200 Anesthesia of skin: Secondary | ICD-10-CM | POA: Diagnosis not present

## 2019-04-06 DIAGNOSIS — G629 Polyneuropathy, unspecified: Secondary | ICD-10-CM

## 2019-04-06 NOTE — Procedures (Signed)
     HISTORY:  Patricia Mason is an 86 year old patient with a history of prior lumbosacral spine surgery who reports a greater than 6-year history of numbness in the feet.  The patient does have some mild discomfort at times in the feet, she is sent to this office for evaluation of a possible neuropathy or a lumbosacral radiculopathy.  NERVE CONDUCTION STUDIES:  Nerve conduction studies were performed on both lower extremities.  The distal motor latencies and motor amplitudes for the peroneal and posterior tibial nerves were within normal limits bilaterally with exception of a low motor amplitude for the right peroneal nerve.  The nerve conduction velocities for the peroneal and posterior tibial nerves were normal bilaterally.  The sural sensory latencies were normal bilaterally but the peroneal sensory latencies were unobtainable bilaterally.  The F-wave latencies for the posterior tibial nerves were normal bilaterally.  EMG STUDIES:  EMG study was performed on the right lower extremity:  The tibialis anterior muscle reveals 2 to 4K motor units with full recruitment. No fibrillations or positive waves were seen. The peroneus tertius muscle reveals 2 to 4K motor units with decreased recruitment. No fibrillations or positive waves were seen. The medial gastrocnemius muscle reveals 1 to 3K motor units with full recruitment. No fibrillations or positive waves were seen. The vastus lateralis muscle reveals 2 to 4K motor units with full recruitment. No fibrillations or positive waves were seen. The iliopsoas muscle reveals 2 to 4K motor units with full recruitment. No fibrillations or positive waves were seen. The biceps femoris muscle (long head) reveals 2 to 4K motor units with full recruitment. No fibrillations or positive waves were seen. The lumbosacral paraspinal muscles were tested at 3 levels, and revealed no abnormalities of insertional activity at all 3 levels tested. There was good  relaxation.   IMPRESSION:  Nerve conduction studies done on both lower extremities shows evidence of mild sensory changes in the lower extremities bilaterally that could be consistent with a mild primarily axonal peripheral neuropathy.  EMG of the right lower extremity does show mild distal neuropathic denervation that is chronic, again consistent with the diagnosis of peripheral neuropathy.  There is no clear evidence of an overlying lumbosacral radiculopathy.  Jill Alexanders MD 04/06/2019 4:27 PM  [facility] [address] [place], [place] [address]  Phone [phone] Fax [phone]

## 2019-04-06 NOTE — Progress Notes (Signed)
[facility]    Nerve / Sites Muscle Latency Ref. Amplitude Ref. Rel Amp Segments Distance Velocity Ref. Area    ms ms mV mV %  cm m/s m/s mVms  R Peroneal - EDB     Ankle EDB 4.1 ?6.5 0.8 ?2.0 100 Ankle - EDB 9   6.7     Fib head EDB 9.8  0.8  99.6 Fib head - Ankle 25 44 ?44 6.7     Pop fossa EDB 12.1  1.2  153 Pop fossa - Fib head 10 44 ?44 6.3         Pop fossa - Ankle      L Peroneal - EDB     Ankle EDB 4.7 ?6.5 4.6 ?2.0 100 Ankle - EDB 9   17.1     Fib head EDB 10.0  4.2  90 Fib head - Ankle 25 47 ?44 16.3     Pop fossa EDB 12.2  4.2  99.7 Pop fossa - Fib head 10 45 ?44 16.7         Pop fossa - Ankle      R Tibial - AH     Ankle AH 4.5 ?5.8 11.8 ?4.0 100 Ankle - AH 9   19.9     Pop fossa AH 12.9  8.2  69.1 Pop fossa - Ankle 35 41 ?41 16.7  L Tibial - AH     Ankle AH 4.2 ?5.8 9.0 ?4.0 100 Ankle - AH 9   22.8     Pop fossa AH 12.4  7.3  81.4 Pop fossa - Ankle 34 41 ?41 20.3             SNC    Nerve / Sites Rec. Site Peak Lat Ref.  Amp Ref. Segments Distance    ms ms V V  cm  R Sural - Ankle (Calf)     Calf Ankle 3.9 ?4.4 2 ?6 Calf - Ankle 14  L Sural - Ankle (Calf)     Calf Ankle 4.1 ?4.4 2 ?6 Calf - Ankle 14  R Superficial peroneal - Ankle     Lat leg Ankle NR ?4.4 NR ?6 Lat leg - Ankle 14  L Superficial peroneal - Ankle     Lat leg Ankle NR ?4.4 NR ?6 Lat leg - Ankle 14             F  Wave    Nerve F Lat Ref.   ms ms  R Tibial - AH 46.5 ?56.0  L Tibial - AH 55.9 ?56.0

## 2019-04-06 NOTE — Progress Notes (Signed)
Please refer to EMG and nerve conduction procedure note.  

## 2019-04-07 ENCOUNTER — Telehealth (HOSPITAL_COMMUNITY): Payer: Self-pay | Admitting: Neurology

## 2019-04-07 NOTE — Progress Notes (Signed)
I called and left a message for the patient on an answering machine to call me back to discuss results of EMG nerve conduction study.

## 2019-04-07 NOTE — Telephone Encounter (Signed)
I spoke to the patient and her son over the phone and communicated results of EMG nerve conduction study confirming peripheral sensory neuropathy.  She has already had lab work in the past for treatable causes which has been negative.  I recommend she continue increasing her gabapentin to 600 mg 3 times daily as instructed.  She seems to be tolerating it well so far without side effects.

## 2019-04-13 DIAGNOSIS — Z6826 Body mass index (BMI) 26.0-26.9, adult: Secondary | ICD-10-CM | POA: Diagnosis not present

## 2019-04-13 DIAGNOSIS — R21 Rash and other nonspecific skin eruption: Secondary | ICD-10-CM | POA: Diagnosis not present

## 2019-04-16 ENCOUNTER — Telehealth: Payer: Self-pay | Admitting: Neurology

## 2019-04-16 NOTE — Telephone Encounter (Signed)
Okay to reduce her gabapentin back to the previous dosage but if constipation does not improve she needs to see a primary care physician to address it

## 2019-04-16 NOTE — Telephone Encounter (Signed)
Pt is asking for a call to discuss the change in her gabapentin (NEURONTIN) 300 MG capsule and how she feels the recent increase is too much for her.  Please call to discuss

## 2019-04-16 NOTE — Telephone Encounter (Signed)
I called pt about not being able to tolerate gabapentin increase dosage. PT stated she is taking 600mg  three times a day. Week one and two she had no issues. She stated on week three she is having constipation, blurred vision while watching tv last night, and her heart was racing. Pt stated the sympts have resolve but she is still constipated.She wanted to know if he wants to decrease the dosage because of her side effects. I stated message will be sent to Dr. Leonie Man. She verbalized understanding.

## 2019-04-16 NOTE — Telephone Encounter (Signed)
I called pt to go back of old dosage of one pill of gabapentin in the am, one in the pm and two at QHS. Pt stated she tolerated the above regimen. I also stated its was recommend to call PCP about constipation.She verbalized understanding.

## 2019-04-28 DIAGNOSIS — Z1331 Encounter for screening for depression: Secondary | ICD-10-CM | POA: Diagnosis not present

## 2019-04-28 DIAGNOSIS — Z6825 Body mass index (BMI) 25.0-25.9, adult: Secondary | ICD-10-CM | POA: Diagnosis not present

## 2019-04-28 DIAGNOSIS — R21 Rash and other nonspecific skin eruption: Secondary | ICD-10-CM | POA: Diagnosis not present

## 2019-04-28 DIAGNOSIS — L308 Other specified dermatitis: Secondary | ICD-10-CM | POA: Diagnosis not present

## 2019-05-01 DIAGNOSIS — L309 Dermatitis, unspecified: Secondary | ICD-10-CM | POA: Diagnosis not present

## 2019-05-01 DIAGNOSIS — C44311 Basal cell carcinoma of skin of nose: Secondary | ICD-10-CM | POA: Diagnosis not present

## 2019-05-01 DIAGNOSIS — B354 Tinea corporis: Secondary | ICD-10-CM | POA: Diagnosis not present

## 2019-05-01 DIAGNOSIS — B351 Tinea unguium: Secondary | ICD-10-CM | POA: Diagnosis not present

## 2019-05-01 DIAGNOSIS — L570 Actinic keratosis: Secondary | ICD-10-CM | POA: Diagnosis not present

## 2019-05-20 DIAGNOSIS — Z6825 Body mass index (BMI) 25.0-25.9, adult: Secondary | ICD-10-CM | POA: Diagnosis not present

## 2019-05-20 DIAGNOSIS — M4726 Other spondylosis with radiculopathy, lumbar region: Secondary | ICD-10-CM | POA: Diagnosis not present

## 2019-05-20 DIAGNOSIS — R21 Rash and other nonspecific skin eruption: Secondary | ICD-10-CM | POA: Diagnosis not present

## 2019-05-20 DIAGNOSIS — N907 Vulvar cyst: Secondary | ICD-10-CM | POA: Diagnosis not present

## 2019-05-26 DIAGNOSIS — L570 Actinic keratosis: Secondary | ICD-10-CM | POA: Diagnosis not present

## 2019-05-26 DIAGNOSIS — C44629 Squamous cell carcinoma of skin of left upper limb, including shoulder: Secondary | ICD-10-CM | POA: Diagnosis not present

## 2019-05-26 DIAGNOSIS — L300 Nummular dermatitis: Secondary | ICD-10-CM | POA: Diagnosis not present

## 2019-06-03 DIAGNOSIS — M4726 Other spondylosis with radiculopathy, lumbar region: Secondary | ICD-10-CM | POA: Diagnosis not present

## 2019-06-03 DIAGNOSIS — M48061 Spinal stenosis, lumbar region without neurogenic claudication: Secondary | ICD-10-CM | POA: Diagnosis not present

## 2019-06-03 DIAGNOSIS — M549 Dorsalgia, unspecified: Secondary | ICD-10-CM | POA: Diagnosis not present

## 2019-06-03 DIAGNOSIS — M79604 Pain in right leg: Secondary | ICD-10-CM | POA: Diagnosis not present

## 2019-06-03 DIAGNOSIS — M48062 Spinal stenosis, lumbar region with neurogenic claudication: Secondary | ICD-10-CM | POA: Diagnosis not present

## 2019-06-08 ENCOUNTER — Ambulatory Visit: Payer: PPO | Admitting: Neurology

## 2019-06-11 DIAGNOSIS — L300 Nummular dermatitis: Secondary | ICD-10-CM | POA: Diagnosis not present

## 2019-06-11 DIAGNOSIS — C44629 Squamous cell carcinoma of skin of left upper limb, including shoulder: Secondary | ICD-10-CM | POA: Diagnosis not present

## 2019-06-22 ENCOUNTER — Other Ambulatory Visit: Payer: Self-pay

## 2019-06-22 ENCOUNTER — Encounter: Payer: Self-pay | Admitting: Neurology

## 2019-06-22 ENCOUNTER — Ambulatory Visit: Payer: PPO | Admitting: Neurology

## 2019-06-22 VITALS — BP 137/62 | HR 72 | Temp 97.0°F | Ht 66.0 in | Wt 150.0 lb

## 2019-06-22 DIAGNOSIS — M792 Neuralgia and neuritis, unspecified: Secondary | ICD-10-CM

## 2019-06-22 NOTE — Progress Notes (Signed)
[facility] [address] [place]. [place] [address] [phone]       OFFICE FOLLOW UP NOTE  Patricia Mason Date of Birth:  03/05/1931 Medical Record Number:  [id number]   Referring MD:  Haydee Salter, NP  Reason for Referral:  Sensory neuropathy  HPI: Initial visit 09/15/18 : Patricia Mason is a 86 year old pleasant Caucasian lady seen today for initial office consultation visit for neuropathy.  History is obtained from the patient as well as referral notes and from her son was present for this visit.  She has a past medical history of hypertension, degenerative disc disease, hypothyroidism basal cell carcinoma of the nose.  States she has a 10-year history of sensory peripheral neuropathy.  She describes this as discomfort in her feet from the ankle down bilaterally.  Over the last 6 years she was being followed at Dr. Legrand Como [facility] neurology clinic in [place] but for the last couple of years since he left this practice she has had no neurological follow-up.  She apparently had a EMG nerve conduction study done in September 2015 which showed absent sural sensory responses but I do not have that report to review.  She states in the last 3 months or so she is noticed increased and worsening particularly in her right leg.  She describes discomfort now present throughout the day but more noticeable when she is is on her feet or at night.  At times she has trouble sleeping.  She is currently taking Neurontin 300 mg 3 times daily which previously used to work well but there is no longer keeping her discomfort under control.  She also admits to balance being slightly off however she has had no major falls.  She can get by indoors without any help but does use a cane for outdoors.  She was recently seen by Dr. Dominica Severin cram neurosurgeon who apparently did some work-up for degenerative back disease but I do not have his records for my review today.  Similarly I have not seen Dr.  Applegate's records for my review today as well.  Patient also has some numbness in her hands but they are it is not as bad.  She denies significant weakness in the hands or diminished fine motor skills.  She does not trip or fall.  She has not tried other medications besides gabapentin for neuropathic pain.  She denies any recent loss of weight, decreased appetite, fever or chronic cough.  She denies history of diabetes or heavy alcohol intake Update 03/31/2019.  Patient returns for follow-up after last visit 6 months ago.  She is accompanied by her son.  She states she was not able to tolerate Topamax which is started at last visit because of dizziness.  She was only on a small dose of 50 mg twice daily and discontinued it.  She remains on gabapentin and the current dose of 300 mg 3 times daily which is tolerating well and states she is never been on a higher dose but still complains of significant paresthesias and numbness in her feet.  This is constant and bothersome.  She had neuropathy panel labs done on 09/15/2018 all of which were normal.  I had requested the EMG nerve conduction study but for unclear reason that has not yet been scheduled.  She did have surgery for left knee replacement on 10/13/2018 which went well.  She has no new complaints today.  I am yet to receive records from Dr. Kary Kos and Dr. Donaciano Eva  office  Update 06/22/2019 : She returns for follow-up after last visit with me 12 months ago.  She is accompanied by her son.  Patient states she did not tolerate increasing the gabapentin to 600 times daily as it made her have some palpitations as well as she developed constipation.  She reduce the dose back to 4 tablets a day and is doing better.  She still has numbness in the feet particularly when she is on her feet and has been walking for some time.  She denies burning or severe pain.  She states she is gotten used to this.  She was previously not able to tolerate Topamax due to side effects  also. She does not want to try Lyrica or alternative agent at this time.  She underwent EMG nerve conduction study done 04/06/2019 by Dr. Jannifer Franklin which confirm sensory peripheral polyneuropathy.  She is has been bothered by back problems with a pinched nerve and she does see Dr. Saintclair Halsted who operated on her neck in the past.  She also is planning on having MRI of her back done. ROS:   14 system review of systems is positive for burning, tingling, numbness, pain, leg discomfort, gait difficulty, balance difficulty, back pain, radicular pain and all other systems negative  PMH:  Past Medical History:  Diagnosis Date  . Arthritis   . Cancer (HCC)    Skin-Squamous cell  . Complication of anesthesia   . Hypertension   . Hyperthyroidism   . Hypothyroidism   . Iron deficiency anemia   . Neuropathy   . PONV (postoperative nausea and vomiting)   . Vitamin B 12 deficiency     Social History:  Social History   Socioeconomic History  . Marital status: Widowed    Spouse name: Not on file  . Number of children: Not on file  . Years of education: Not on file  . Highest education level: Not on file  Occupational History  . Not on file  Tobacco Use  . Smoking status: Never Smoker  . Smokeless tobacco: Never Used  Substance and Sexual Activity  . Alcohol use: Never  . Drug use: Never  . Sexual activity: Not on file  Other Topics Concern  . Not on file  Social History Narrative  . Not on file   Social Determinants of Health   Financial Resource Strain:   . Difficulty of Paying Living Expenses:   Food Insecurity:   . Worried About Charity fundraiser in the Last Year:   . Arboriculturist in the Last Year:   Transportation Needs:   . Film/video editor (Medical):   Marland Kitchen Lack of Transportation (Non-Medical):   Physical Activity:   . Days of Exercise per Week:   . Minutes of Exercise per Session:   Stress:   . Feeling of Stress :   Social Connections:   . Frequency of Communication  with Friends and Family:   . Frequency of Social Gatherings with Friends and Family:   . Attends Religious Services:   . Active Member of Clubs or Organizations:   . Attends Archivist Meetings:   Marland Kitchen Marital Status:   Intimate Partner Violence:   . Fear of Current or Ex-Partner:   . Emotionally Abused:   Marland Kitchen Physically Abused:   . Sexually Abused:     Medications:   Current Outpatient Medications on File Prior to Visit  Medication Sig Dispense Refill  . aspirin 325 MG EC tablet Take  1 tablet (325 mg total) by mouth 2 (two) times daily. 30 tablet 0  . Biotin 5000 MCG TABS Take 5,000 mcg by mouth daily.     . Calcium Carb-Cholecalciferol (CALCIUM 600+D3 PO) Take 1 tablet by mouth daily.    . ferrous sulfate 325 (65 FE) MG tablet Take 325 mg by mouth daily with breakfast.    . fexofenadine (ALLEGRA) 180 MG tablet Take 180 mg by mouth daily.    Marland Kitchen gabapentin (NEURONTIN) 300 MG capsule Take 600 mg by mouth 4 (four) times daily.     Marland Kitchen levothyroxine (SYNTHROID, LEVOTHROID) 88 MCG tablet Take 88 mcg by mouth daily before breakfast.    . methocarbamol (ROBAXIN) 500 MG tablet Take 1-2 tablets (500-1,000 mg total) by mouth every 6 (six) hours as needed for muscle spasms. 60 tablet 0  . Omega-3 Fatty Acids (FISH OIL) 1000 MG CAPS Take 1,000 mg by mouth daily.    . traZODone (DESYREL) 50 MG tablet Take 25-50 mg by mouth at bedtime.     . triamterene-hydrochlorothiazide (MAXZIDE-25) 37.5-25 MG tablet Take 1 tablet by mouth daily.     . vitamin B-12 (CYANOCOBALAMIN) 1000 MCG tablet Take 1,000 mcg by mouth daily.     No current facility-administered medications on file prior to visit.    Allergies:   Allergies  Allergen Reactions  . Prednisone Hives  . Sulfa Antibiotics Anaphylaxis and Rash    Throat swelling/fever    Physical Exam General: frail elderly Caucasian lady, seated, in no evident distress Head: head normocephalic and atraumatic.   Neck: supple with no carotid or  supraclavicular bruits.  Surgical scar from prior thyroid surgery in the neck Cardiovascular: regular rate and rhythm, no murmurs Musculoskeletal: no deformity Skin:  no rash/petichiae Vascular:  Normal pulses all extremities  Neurologic Exam Mental Status: Awake and fully alert. Oriented to place and time. Recent and remote memory intact. Attention span, concentration and fund of knowledge appropriate. Mood and affect appropriate.  Cranial Nerves: Fundoscopic exam not done pupils equal, briskly reactive to light. Extraocular movements full without nystagmus. Visual fields full to confrontation. Hearing intact. Facial sensation intact. Face, tongue, palate moves normally and symmetrically.  Motor: Normal bulk and tone. Normal strength in all tested extremity muscles. Sensory.: intact to touch , pinprick , position sensation but diminished vibratory sensation bilaterally from ankle down..  Romberg sign is negative Coordination: Rapid alternating movements normal in all extremities. Finger-to-nose and heel-to-shin performed accurately bilaterally. Gait and Station: Arises from chair without difficulty. Stance is normal. Gait demonstrates normal stride length and balance .  She is unable to heel, toe and tandem walk  .  Reflexes: 1+ and symmetric except both ankle jerks are absent.. Toes downgoing.     ASSESSMENT: 53 lady with chronic sensory peripheral neuropathy with recent flareup and her lower extremity paresthesias of unclear etiology.  Recent flareup of her chronic back pain for which she is following up with Dr. Saintclair Halsted neurosurgery    PLAN: I had a long discussion with the patient and her son regarding her numbness in her feet and sensory neuropathy and answered questions.  Patient was unable to tolerate a higher dose of gabapentin hence I recommend she continue on the current dose of 300 mg in the morning and afternoon and 600 mg at night.  We also discussed fall prevention precautions.   She was advised to use a cane at all times while walking outdoors and long distances.  She will keep her scheduled appointment with  Dr. Saintclair Halsted for her back pain next week.  She will return for follow-up in the future with my nurse practitioner Janett Billow in 6 months or call earlier if necessary.  Greater than 50% time during this 25-minute  visit was spent on counseling and coordination of care about her paresthesias and sensory neuropathy and discussion about evaluation and treatment and answering questions.  Antony Contras, MD  [facility] [address] [place], [place] [address]  Phone [phone] Fax [phone] Note: This document was prepared with digital dictation and possible smart phrase technology. Any transcriptional errors that result from this process are unintentional.

## 2019-06-22 NOTE — Patient Instructions (Signed)
I had a long discussion with the patient and her son regarding her numbness in her feet and sensory neuropathy and answered questions.  Patient was unable to tolerate a higher dose of gabapentin hence I recommend she continue on the current dose of 300 mg in the morning and afternoon and 600 mg at night.  We also discussed fall prevention precautions.  She was advised to use a cane at all times while walking outdoors and long distances.  She will keep her scheduled appointment with Dr. Saintclair Halsted for her back pain next week.  She will return for follow-up in the future with my nurse practitioner Janett Billow in 6 months or call earlier if necessary.  Fall Prevention in the Home, Adult Falls can cause injuries. They can happen to people of all ages. There are many things you can do to make your home safe and to help prevent falls. Ask for help when making these changes, if needed. What actions can I take to prevent falls? General Instructions  Use good lighting in all rooms. Replace any light bulbs that burn out.  Turn on the lights when you go into a dark area. Use night-lights.  Keep items that you use often in easy-to-reach places. Lower the shelves around your home if necessary.  Set up your furniture so you have a clear path. Avoid moving your furniture around.  Do not have throw rugs and other things on the floor that can make you trip.  Avoid walking on wet floors.  If any of your floors are uneven, fix them.  Add color or contrast paint or tape to clearly mark and help you see: ? Any grab bars or handrails. ? First and last steps of stairways. ? Where the edge of each step is.  If you use a stepladder: ? Make sure that it is fully opened. Do not climb a closed stepladder. ? Make sure that both sides of the stepladder are locked into place. ? Ask someone to hold the stepladder for you while you use it.  If there are any pets around you, be aware of where they are. What can I do in the  bathroom?      Keep the floor dry. Clean up any water that spills onto the floor as soon as it happens.  Remove soap buildup in the tub or shower regularly.  Use non-skid mats or decals on the floor of the tub or shower.  Attach bath mats securely with double-sided, non-slip rug tape.  If you need to sit down in the shower, use a plastic, non-slip stool.  Install grab bars by the toilet and in the tub and shower. Do not use towel bars as grab bars. What can I do in the bedroom?  Make sure that you have a light by your bed that is easy to reach.  Do not use any sheets or blankets that are too big for your bed. They should not hang down onto the floor.  Have a firm chair that has side arms. You can use this for support while you get dressed. What can I do in the kitchen?  Clean up any spills right away.  If you need to reach something above you, use a strong step stool that has a grab bar.  Keep electrical cords out of the way.  Do not use floor polish or wax that makes floors slippery. If you must use wax, use non-skid floor wax. What can I do with my stairs?  Do not leave any items on the stairs.  Make sure that you have a light switch at the top of the stairs and the bottom of the stairs. If you do not have them, ask someone to add them for you.  Make sure that there are handrails on both sides of the stairs, and use them. Fix handrails that are broken or loose. Make sure that handrails are as long as the stairways.  Install non-slip stair treads on all stairs in your home.  Avoid having throw rugs at the top or bottom of the stairs. If you do have throw rugs, attach them to the floor with carpet tape.  Choose a carpet that does not hide the edge of the steps on the stairway.  Check any carpeting to make sure that it is firmly attached to the stairs. Fix any carpet that is loose or worn. What can I do on the outside of my home?  Use bright outdoor  lighting.  Regularly fix the edges of walkways and driveways and fix any cracks.  Remove anything that might make you trip as you walk through a door, such as a raised step or threshold.  Trim any bushes or trees on the path to your home.  Regularly check to see if handrails are loose or broken. Make sure that both sides of any steps have handrails.  Install guardrails along the edges of any raised decks and porches.  Clear walking paths of anything that might make someone trip, such as tools or rocks.  Have any leaves, snow, or ice cleared regularly.  Use sand or salt on walking paths during winter.  Clean up any spills in your garage right away. This includes grease or oil spills. What other actions can I take?  Wear shoes that: ? Have a low heel. Do not wear high heels. ? Have rubber bottoms. ? Are comfortable and fit you well. ? Are closed at the toe. Do not wear open-toe sandals.  Use tools that help you move around (mobility aids) if they are needed. These include: ? Canes. ? Walkers. ? Scooters. ? Crutches.  Review your medicines with your doctor. Some medicines can make you feel dizzy. This can increase your chance of falling. Ask your doctor what other things you can do to help prevent falls. Where to find more information  Centers for Disease Control and Prevention, STEADI: https://garcia.biz/  [employer] on Aging: BrainJudge.co.uk Contact a doctor if:  You are afraid of falling at home.  You feel weak, drowsy, or dizzy at home.  You fall at home. Summary  There are many simple things that you can do to make your home safe and to help prevent falls.  Ways to make your home safe include removing tripping hazards and installing grab bars in the bathroom.  Ask for help when making these changes in your home. This information is not intended to replace advice given to you by your health care provider. Make sure you discuss any questions you  have with your health care provider. Document Revised: 06/26/2018 Document Reviewed: 10/18/2016 Elsevier Patient Education  2020 [employer].

## 2019-06-25 DIAGNOSIS — M5126 Other intervertebral disc displacement, lumbar region: Secondary | ICD-10-CM | POA: Diagnosis not present

## 2019-07-07 DIAGNOSIS — M5126 Other intervertebral disc displacement, lumbar region: Secondary | ICD-10-CM | POA: Diagnosis not present

## 2019-07-16 DIAGNOSIS — M9903 Segmental and somatic dysfunction of lumbar region: Secondary | ICD-10-CM | POA: Diagnosis not present

## 2019-07-16 DIAGNOSIS — M5136 Other intervertebral disc degeneration, lumbar region: Secondary | ICD-10-CM | POA: Diagnosis not present

## 2019-07-16 DIAGNOSIS — M5442 Lumbago with sciatica, left side: Secondary | ICD-10-CM | POA: Diagnosis not present

## 2019-07-16 DIAGNOSIS — M5137 Other intervertebral disc degeneration, lumbosacral region: Secondary | ICD-10-CM | POA: Diagnosis not present

## 2019-07-16 DIAGNOSIS — M9904 Segmental and somatic dysfunction of sacral region: Secondary | ICD-10-CM | POA: Diagnosis not present

## 2019-07-20 DIAGNOSIS — M5442 Lumbago with sciatica, left side: Secondary | ICD-10-CM | POA: Diagnosis not present

## 2019-07-20 DIAGNOSIS — M9904 Segmental and somatic dysfunction of sacral region: Secondary | ICD-10-CM | POA: Diagnosis not present

## 2019-07-20 DIAGNOSIS — M5137 Other intervertebral disc degeneration, lumbosacral region: Secondary | ICD-10-CM | POA: Diagnosis not present

## 2019-07-20 DIAGNOSIS — M9903 Segmental and somatic dysfunction of lumbar region: Secondary | ICD-10-CM | POA: Diagnosis not present

## 2019-07-20 DIAGNOSIS — M5136 Other intervertebral disc degeneration, lumbar region: Secondary | ICD-10-CM | POA: Diagnosis not present

## 2019-07-22 DIAGNOSIS — M5442 Lumbago with sciatica, left side: Secondary | ICD-10-CM | POA: Diagnosis not present

## 2019-07-22 DIAGNOSIS — M9903 Segmental and somatic dysfunction of lumbar region: Secondary | ICD-10-CM | POA: Diagnosis not present

## 2019-07-22 DIAGNOSIS — M9904 Segmental and somatic dysfunction of sacral region: Secondary | ICD-10-CM | POA: Diagnosis not present

## 2019-07-22 DIAGNOSIS — M5136 Other intervertebral disc degeneration, lumbar region: Secondary | ICD-10-CM | POA: Diagnosis not present

## 2019-07-22 DIAGNOSIS — M5137 Other intervertebral disc degeneration, lumbosacral region: Secondary | ICD-10-CM | POA: Diagnosis not present

## 2019-07-24 DIAGNOSIS — M9904 Segmental and somatic dysfunction of sacral region: Secondary | ICD-10-CM | POA: Diagnosis not present

## 2019-07-24 DIAGNOSIS — M5442 Lumbago with sciatica, left side: Secondary | ICD-10-CM | POA: Diagnosis not present

## 2019-07-24 DIAGNOSIS — M5136 Other intervertebral disc degeneration, lumbar region: Secondary | ICD-10-CM | POA: Diagnosis not present

## 2019-07-24 DIAGNOSIS — M5137 Other intervertebral disc degeneration, lumbosacral region: Secondary | ICD-10-CM | POA: Diagnosis not present

## 2019-07-24 DIAGNOSIS — M9903 Segmental and somatic dysfunction of lumbar region: Secondary | ICD-10-CM | POA: Diagnosis not present

## 2019-07-27 DIAGNOSIS — M9903 Segmental and somatic dysfunction of lumbar region: Secondary | ICD-10-CM | POA: Diagnosis not present

## 2019-07-27 DIAGNOSIS — M5442 Lumbago with sciatica, left side: Secondary | ICD-10-CM | POA: Diagnosis not present

## 2019-07-27 DIAGNOSIS — M5136 Other intervertebral disc degeneration, lumbar region: Secondary | ICD-10-CM | POA: Diagnosis not present

## 2019-07-27 DIAGNOSIS — M9904 Segmental and somatic dysfunction of sacral region: Secondary | ICD-10-CM | POA: Diagnosis not present

## 2019-07-27 DIAGNOSIS — M5137 Other intervertebral disc degeneration, lumbosacral region: Secondary | ICD-10-CM | POA: Diagnosis not present

## 2019-07-29 DIAGNOSIS — M9903 Segmental and somatic dysfunction of lumbar region: Secondary | ICD-10-CM | POA: Diagnosis not present

## 2019-07-29 DIAGNOSIS — M5136 Other intervertebral disc degeneration, lumbar region: Secondary | ICD-10-CM | POA: Diagnosis not present

## 2019-07-29 DIAGNOSIS — M5442 Lumbago with sciatica, left side: Secondary | ICD-10-CM | POA: Diagnosis not present

## 2019-07-29 DIAGNOSIS — M9904 Segmental and somatic dysfunction of sacral region: Secondary | ICD-10-CM | POA: Diagnosis not present

## 2019-07-29 DIAGNOSIS — M5137 Other intervertebral disc degeneration, lumbosacral region: Secondary | ICD-10-CM | POA: Diagnosis not present

## 2019-07-31 DIAGNOSIS — M5137 Other intervertebral disc degeneration, lumbosacral region: Secondary | ICD-10-CM | POA: Diagnosis not present

## 2019-07-31 DIAGNOSIS — M5136 Other intervertebral disc degeneration, lumbar region: Secondary | ICD-10-CM | POA: Diagnosis not present

## 2019-07-31 DIAGNOSIS — M9904 Segmental and somatic dysfunction of sacral region: Secondary | ICD-10-CM | POA: Diagnosis not present

## 2019-07-31 DIAGNOSIS — M5442 Lumbago with sciatica, left side: Secondary | ICD-10-CM | POA: Diagnosis not present

## 2019-07-31 DIAGNOSIS — M9903 Segmental and somatic dysfunction of lumbar region: Secondary | ICD-10-CM | POA: Diagnosis not present

## 2019-08-03 DIAGNOSIS — M5137 Other intervertebral disc degeneration, lumbosacral region: Secondary | ICD-10-CM | POA: Diagnosis not present

## 2019-08-03 DIAGNOSIS — M5136 Other intervertebral disc degeneration, lumbar region: Secondary | ICD-10-CM | POA: Diagnosis not present

## 2019-08-03 DIAGNOSIS — M5442 Lumbago with sciatica, left side: Secondary | ICD-10-CM | POA: Diagnosis not present

## 2019-08-03 DIAGNOSIS — M9904 Segmental and somatic dysfunction of sacral region: Secondary | ICD-10-CM | POA: Diagnosis not present

## 2019-08-03 DIAGNOSIS — M9903 Segmental and somatic dysfunction of lumbar region: Secondary | ICD-10-CM | POA: Diagnosis not present

## 2019-08-05 DIAGNOSIS — M9903 Segmental and somatic dysfunction of lumbar region: Secondary | ICD-10-CM | POA: Diagnosis not present

## 2019-08-05 DIAGNOSIS — M5137 Other intervertebral disc degeneration, lumbosacral region: Secondary | ICD-10-CM | POA: Diagnosis not present

## 2019-08-05 DIAGNOSIS — M5442 Lumbago with sciatica, left side: Secondary | ICD-10-CM | POA: Diagnosis not present

## 2019-08-05 DIAGNOSIS — M9904 Segmental and somatic dysfunction of sacral region: Secondary | ICD-10-CM | POA: Diagnosis not present

## 2019-08-05 DIAGNOSIS — M5136 Other intervertebral disc degeneration, lumbar region: Secondary | ICD-10-CM | POA: Diagnosis not present

## 2019-08-07 DIAGNOSIS — M9903 Segmental and somatic dysfunction of lumbar region: Secondary | ICD-10-CM | POA: Diagnosis not present

## 2019-08-07 DIAGNOSIS — M5137 Other intervertebral disc degeneration, lumbosacral region: Secondary | ICD-10-CM | POA: Diagnosis not present

## 2019-08-07 DIAGNOSIS — M5136 Other intervertebral disc degeneration, lumbar region: Secondary | ICD-10-CM | POA: Diagnosis not present

## 2019-08-07 DIAGNOSIS — M9904 Segmental and somatic dysfunction of sacral region: Secondary | ICD-10-CM | POA: Diagnosis not present

## 2019-08-07 DIAGNOSIS — M5442 Lumbago with sciatica, left side: Secondary | ICD-10-CM | POA: Diagnosis not present

## 2019-08-10 DIAGNOSIS — M5442 Lumbago with sciatica, left side: Secondary | ICD-10-CM | POA: Diagnosis not present

## 2019-08-10 DIAGNOSIS — M5136 Other intervertebral disc degeneration, lumbar region: Secondary | ICD-10-CM | POA: Diagnosis not present

## 2019-08-10 DIAGNOSIS — M5137 Other intervertebral disc degeneration, lumbosacral region: Secondary | ICD-10-CM | POA: Diagnosis not present

## 2019-08-10 DIAGNOSIS — M9903 Segmental and somatic dysfunction of lumbar region: Secondary | ICD-10-CM | POA: Diagnosis not present

## 2019-08-10 DIAGNOSIS — M9904 Segmental and somatic dysfunction of sacral region: Secondary | ICD-10-CM | POA: Diagnosis not present

## 2019-08-12 DIAGNOSIS — M5137 Other intervertebral disc degeneration, lumbosacral region: Secondary | ICD-10-CM | POA: Diagnosis not present

## 2019-08-12 DIAGNOSIS — M5136 Other intervertebral disc degeneration, lumbar region: Secondary | ICD-10-CM | POA: Diagnosis not present

## 2019-08-12 DIAGNOSIS — M9904 Segmental and somatic dysfunction of sacral region: Secondary | ICD-10-CM | POA: Diagnosis not present

## 2019-08-12 DIAGNOSIS — M5442 Lumbago with sciatica, left side: Secondary | ICD-10-CM | POA: Diagnosis not present

## 2019-08-12 DIAGNOSIS — M9903 Segmental and somatic dysfunction of lumbar region: Secondary | ICD-10-CM | POA: Diagnosis not present

## 2019-08-14 DIAGNOSIS — M9903 Segmental and somatic dysfunction of lumbar region: Secondary | ICD-10-CM | POA: Diagnosis not present

## 2019-08-14 DIAGNOSIS — M5442 Lumbago with sciatica, left side: Secondary | ICD-10-CM | POA: Diagnosis not present

## 2019-08-14 DIAGNOSIS — M5136 Other intervertebral disc degeneration, lumbar region: Secondary | ICD-10-CM | POA: Diagnosis not present

## 2019-08-14 DIAGNOSIS — M9904 Segmental and somatic dysfunction of sacral region: Secondary | ICD-10-CM | POA: Diagnosis not present

## 2019-08-14 DIAGNOSIS — M5137 Other intervertebral disc degeneration, lumbosacral region: Secondary | ICD-10-CM | POA: Diagnosis not present

## 2019-08-18 DIAGNOSIS — M9903 Segmental and somatic dysfunction of lumbar region: Secondary | ICD-10-CM | POA: Diagnosis not present

## 2019-08-18 DIAGNOSIS — M5442 Lumbago with sciatica, left side: Secondary | ICD-10-CM | POA: Diagnosis not present

## 2019-08-18 DIAGNOSIS — M5136 Other intervertebral disc degeneration, lumbar region: Secondary | ICD-10-CM | POA: Diagnosis not present

## 2019-08-18 DIAGNOSIS — M5137 Other intervertebral disc degeneration, lumbosacral region: Secondary | ICD-10-CM | POA: Diagnosis not present

## 2019-08-18 DIAGNOSIS — M9904 Segmental and somatic dysfunction of sacral region: Secondary | ICD-10-CM | POA: Diagnosis not present

## 2019-08-19 DIAGNOSIS — M5442 Lumbago with sciatica, left side: Secondary | ICD-10-CM | POA: Diagnosis not present

## 2019-08-19 DIAGNOSIS — M9904 Segmental and somatic dysfunction of sacral region: Secondary | ICD-10-CM | POA: Diagnosis not present

## 2019-08-19 DIAGNOSIS — M5137 Other intervertebral disc degeneration, lumbosacral region: Secondary | ICD-10-CM | POA: Diagnosis not present

## 2019-08-19 DIAGNOSIS — M5136 Other intervertebral disc degeneration, lumbar region: Secondary | ICD-10-CM | POA: Diagnosis not present

## 2019-08-19 DIAGNOSIS — M9903 Segmental and somatic dysfunction of lumbar region: Secondary | ICD-10-CM | POA: Diagnosis not present

## 2019-08-21 DIAGNOSIS — M5137 Other intervertebral disc degeneration, lumbosacral region: Secondary | ICD-10-CM | POA: Diagnosis not present

## 2019-08-21 DIAGNOSIS — M9903 Segmental and somatic dysfunction of lumbar region: Secondary | ICD-10-CM | POA: Diagnosis not present

## 2019-08-21 DIAGNOSIS — M9904 Segmental and somatic dysfunction of sacral region: Secondary | ICD-10-CM | POA: Diagnosis not present

## 2019-08-21 DIAGNOSIS — M5136 Other intervertebral disc degeneration, lumbar region: Secondary | ICD-10-CM | POA: Diagnosis not present

## 2019-08-21 DIAGNOSIS — M5442 Lumbago with sciatica, left side: Secondary | ICD-10-CM | POA: Diagnosis not present

## 2019-09-02 DIAGNOSIS — M9903 Segmental and somatic dysfunction of lumbar region: Secondary | ICD-10-CM | POA: Diagnosis not present

## 2019-09-02 DIAGNOSIS — M9904 Segmental and somatic dysfunction of sacral region: Secondary | ICD-10-CM | POA: Diagnosis not present

## 2019-09-02 DIAGNOSIS — M5136 Other intervertebral disc degeneration, lumbar region: Secondary | ICD-10-CM | POA: Diagnosis not present

## 2019-09-02 DIAGNOSIS — M5137 Other intervertebral disc degeneration, lumbosacral region: Secondary | ICD-10-CM | POA: Diagnosis not present

## 2019-09-02 DIAGNOSIS — M5442 Lumbago with sciatica, left side: Secondary | ICD-10-CM | POA: Diagnosis not present

## 2019-09-04 DIAGNOSIS — C44629 Squamous cell carcinoma of skin of left upper limb, including shoulder: Secondary | ICD-10-CM | POA: Diagnosis not present

## 2019-09-04 DIAGNOSIS — L578 Other skin changes due to chronic exposure to nonionizing radiation: Secondary | ICD-10-CM | POA: Diagnosis not present

## 2019-09-04 DIAGNOSIS — L821 Other seborrheic keratosis: Secondary | ICD-10-CM | POA: Diagnosis not present

## 2019-09-04 DIAGNOSIS — L570 Actinic keratosis: Secondary | ICD-10-CM | POA: Diagnosis not present

## 2019-09-04 DIAGNOSIS — L300 Nummular dermatitis: Secondary | ICD-10-CM | POA: Diagnosis not present

## 2019-09-10 DIAGNOSIS — F5101 Primary insomnia: Secondary | ICD-10-CM | POA: Diagnosis not present

## 2019-09-10 DIAGNOSIS — G629 Polyneuropathy, unspecified: Secondary | ICD-10-CM | POA: Diagnosis not present

## 2019-09-10 DIAGNOSIS — Z79899 Other long term (current) drug therapy: Secondary | ICD-10-CM | POA: Diagnosis not present

## 2019-09-10 DIAGNOSIS — Z0000 Encounter for general adult medical examination without abnormal findings: Secondary | ICD-10-CM | POA: Diagnosis not present

## 2019-09-10 DIAGNOSIS — Z1231 Encounter for screening mammogram for malignant neoplasm of breast: Secondary | ICD-10-CM | POA: Diagnosis not present

## 2019-09-10 DIAGNOSIS — Z1339 Encounter for screening examination for other mental health and behavioral disorders: Secondary | ICD-10-CM | POA: Diagnosis not present

## 2019-09-10 DIAGNOSIS — E039 Hypothyroidism, unspecified: Secondary | ICD-10-CM | POA: Diagnosis not present

## 2019-09-10 DIAGNOSIS — G3184 Mild cognitive impairment, so stated: Secondary | ICD-10-CM | POA: Diagnosis not present

## 2019-09-10 DIAGNOSIS — I10 Essential (primary) hypertension: Secondary | ICD-10-CM | POA: Diagnosis not present

## 2019-09-20 ENCOUNTER — Ambulatory Visit: Admit: 2019-09-20 | Payer: PPO | Admitting: Orthopedic Surgery

## 2019-09-20 SURGERY — ARTHROPLASTY, KNEE, TOTAL
Anesthesia: Spinal | Laterality: Left

## 2019-09-30 DIAGNOSIS — M9903 Segmental and somatic dysfunction of lumbar region: Secondary | ICD-10-CM | POA: Diagnosis not present

## 2019-09-30 DIAGNOSIS — M5137 Other intervertebral disc degeneration, lumbosacral region: Secondary | ICD-10-CM | POA: Diagnosis not present

## 2019-09-30 DIAGNOSIS — M5136 Other intervertebral disc degeneration, lumbar region: Secondary | ICD-10-CM | POA: Diagnosis not present

## 2019-09-30 DIAGNOSIS — M5442 Lumbago with sciatica, left side: Secondary | ICD-10-CM | POA: Diagnosis not present

## 2019-09-30 DIAGNOSIS — M9904 Segmental and somatic dysfunction of sacral region: Secondary | ICD-10-CM | POA: Diagnosis not present

## 2019-10-28 DIAGNOSIS — M5137 Other intervertebral disc degeneration, lumbosacral region: Secondary | ICD-10-CM | POA: Diagnosis not present

## 2019-10-28 DIAGNOSIS — M5136 Other intervertebral disc degeneration, lumbar region: Secondary | ICD-10-CM | POA: Diagnosis not present

## 2019-10-28 DIAGNOSIS — M9903 Segmental and somatic dysfunction of lumbar region: Secondary | ICD-10-CM | POA: Diagnosis not present

## 2019-10-28 DIAGNOSIS — M9904 Segmental and somatic dysfunction of sacral region: Secondary | ICD-10-CM | POA: Diagnosis not present

## 2019-10-28 DIAGNOSIS — M5442 Lumbago with sciatica, left side: Secondary | ICD-10-CM | POA: Diagnosis not present

## 2019-10-29 DIAGNOSIS — C44629 Squamous cell carcinoma of skin of left upper limb, including shoulder: Secondary | ICD-10-CM | POA: Diagnosis not present

## 2019-10-29 DIAGNOSIS — L300 Nummular dermatitis: Secondary | ICD-10-CM | POA: Diagnosis not present

## 2019-11-11 DIAGNOSIS — Z1231 Encounter for screening mammogram for malignant neoplasm of breast: Secondary | ICD-10-CM | POA: Diagnosis not present

## 2019-11-25 DIAGNOSIS — M9904 Segmental and somatic dysfunction of sacral region: Secondary | ICD-10-CM | POA: Diagnosis not present

## 2019-11-25 DIAGNOSIS — M5442 Lumbago with sciatica, left side: Secondary | ICD-10-CM | POA: Diagnosis not present

## 2019-11-25 DIAGNOSIS — M5137 Other intervertebral disc degeneration, lumbosacral region: Secondary | ICD-10-CM | POA: Diagnosis not present

## 2019-11-25 DIAGNOSIS — M9903 Segmental and somatic dysfunction of lumbar region: Secondary | ICD-10-CM | POA: Diagnosis not present

## 2019-11-25 DIAGNOSIS — M5136 Other intervertebral disc degeneration, lumbar region: Secondary | ICD-10-CM | POA: Diagnosis not present

## 2019-12-23 DIAGNOSIS — M9904 Segmental and somatic dysfunction of sacral region: Secondary | ICD-10-CM | POA: Diagnosis not present

## 2019-12-23 DIAGNOSIS — M5442 Lumbago with sciatica, left side: Secondary | ICD-10-CM | POA: Diagnosis not present

## 2019-12-23 DIAGNOSIS — M5136 Other intervertebral disc degeneration, lumbar region: Secondary | ICD-10-CM | POA: Diagnosis not present

## 2019-12-23 DIAGNOSIS — M9903 Segmental and somatic dysfunction of lumbar region: Secondary | ICD-10-CM | POA: Diagnosis not present

## 2019-12-23 DIAGNOSIS — M5137 Other intervertebral disc degeneration, lumbosacral region: Secondary | ICD-10-CM | POA: Diagnosis not present

## 2019-12-24 ENCOUNTER — Ambulatory Visit: Payer: PPO | Admitting: Adult Health

## 2019-12-24 ENCOUNTER — Encounter: Payer: Self-pay | Admitting: Adult Health

## 2019-12-24 VITALS — BP 157/69 | HR 74 | Ht 64.0 in | Wt 152.0 lb

## 2019-12-24 DIAGNOSIS — R200 Anesthesia of skin: Secondary | ICD-10-CM

## 2019-12-24 DIAGNOSIS — R269 Unspecified abnormalities of gait and mobility: Secondary | ICD-10-CM

## 2019-12-24 DIAGNOSIS — G629 Polyneuropathy, unspecified: Secondary | ICD-10-CM | POA: Diagnosis not present

## 2019-12-24 MED ORDER — GABAPENTIN 300 MG PO CAPS
ORAL_CAPSULE | ORAL | 3 refills | Status: DC
Start: 2019-12-24 — End: 2021-01-23

## 2019-12-24 NOTE — Patient Instructions (Addendum)
Your Plan:  Continue current dose of gabapentin If you start to experience any painful nerve pain such as burning, radiating or electrical shock type symptoms, we can consider adding an additional medication to your gabapentin such as duloxetine (Cymbalta) or switching to a different medication such as pregabalin (Lyrica)  Your hand pain is likely more due to tendon or ligament issues or even arthritis and would not be caused from your neuropathy or a neurological issue.  You can trial topical over-the-counter ointments such as Aspercreme or Voltaren gel for possible benefit.  Please speak further with your PCP in regards to this if symptoms persist as you may need to be evaluated by orthopedics or hand specialist    Follow-up in 6 months or call earlier if needed    Thank you for coming to see [place] at [facility]. I hope we have been able to provide you high quality care today.  You may receive a patient satisfaction survey over the next few weeks. We would appreciate your feedback and comments so that we may continue to improve ourselves and the health of our patients.

## 2019-12-24 NOTE — Progress Notes (Signed)
[facility] [address] [place]. [place] [address] [phone]       OFFICE FOLLOW UP NOTE  [name] Date of Birth:  10-25-1930 Medical Record Number:  [id number]   Referring MD:  Haydee Salter, NP  Reason for Referral:  Sensory neuropathy   Chief Complaint  Patient presents with  . Follow-up    tx rm  . Peripheral Neuropathy    Pt is having more cramps in her hands. Pt said the pain goes from her finger to her wrist. Pt here her son Patricia Mason.     HPI:   Today, 12/24/2019, Patricia Mason returns for 84-month follow-up accompanied by her son for sensory neuropathy.  Neuropathy has been stable since prior visit with ongoing use of gabapentin 300/300/600 tolerating dosage well without side effects.  She continues to have numbness in her feet and right leg cramping (present since knee surgery) but denies any painful symptoms such as burning, pinprick or electrical shock sensations.  Continues to use a cane for ambulation and denies any recent falls.  She does report right pointer finger catching sensation with at times wanting to hyperextend.  Reports occasional cramping sensation but otherwise denies pain.  She is unsure if related to arthritis as she has extensive bilateral hand arthritis and inability to fully flex right pointer finger which is chronic.  She continues to follow with Dr. Saintclair Halsted for chronic lumbar disease.  No further concerns.     History provided for reference purposes only Update 06/22/2019 Dr. Leonie Man : She returns for follow-up after last visit with me 84 months ago.  She is accompanied by her son.  Patient states she did not tolerate increasing the gabapentin to 600 times daily as it made her have some palpitations as well as she developed constipation.  She reduce the dose back to 4 tablets a day and is doing better.  She still has numbness in the feet particularly when she is on her feet and has been walking for some time.  She denies burning or  severe pain.  She states she is gotten used to this.  She was previously not able to tolerate Topamax due to side effects also. She does not want to try Lyrica or alternative agent at this time.  She underwent EMG nerve conduction study done 04/06/2019 by Dr. Jannifer Franklin which confirm sensory peripheral polyneuropathy.  She is has been bothered by back problems with a pinched nerve and she does see Dr. Saintclair Halsted who operated on her neck in the past.  She also is planning on having MRI of her back done.  Update 03/31/2019 Dr. Leonie Man:  Patient returns for follow-up after last visit 84 months ago.  She is accompanied by her son.  She states she was not able to tolerate Topamax which is started at last visit because of dizziness.  She was only on a small dose of 50 mg twice daily and discontinued it.  She remains on gabapentin and the current dose of 300 mg 3 times daily which is tolerating well and states she is never been on a higher dose but still complains of significant paresthesias and numbness in her feet.  This is constant and bothersome.  She had neuropathy panel labs done on 09/15/2018 all of which were normal.  I had requested the EMG nerve conduction study but for unclear reason that has not yet been scheduled.  She did have surgery for left knee replacement on 10/13/2018 which went well.  She  has no new complaints today.  I am yet to receive records from Dr. Kary Kos and Dr. Applegate's office   Initial visit 09/15/18 Dr. Leonie Man : Patricia Mason is a 86 year old pleasant Caucasian lady seen today for initial office consultation visit for neuropathy.  History is obtained from the patient as well as referral notes and from her son was present for this visit.  She has a past medical history of hypertension, degenerative disc disease, hypothyroidism basal cell carcinoma of the nose.  States she has a 10-year history of sensory peripheral neuropathy.  She describes this as discomfort in her feet from the ankle down bilaterally.   Over the last 6 years she was being followed at Dr. Legrand Como Applegate's neurology clinic in [place] but for the last couple of years since he left this practice she has had no neurological follow-up.  She apparently had a EMG nerve conduction study done in September 2015 which showed absent sural sensory responses but I do not have that report to review.  She states in the last 3 months or so she is noticed increased and worsening particularly in her right leg.  She describes discomfort now present throughout the day but more noticeable when she is is on her feet or at night.  At times she has trouble sleeping.  She is currently taking Neurontin 300 mg 3 times daily which previously used to work well but there is no longer keeping her discomfort under control.  She also admits to balance being slightly off however she has had no major falls.  She can get by indoors without any help but does use a cane for outdoors.  She was recently seen by Dr. Dominica Severin cram neurosurgeon who apparently did some work-up for degenerative back disease but I do not have his records for my review today.  Similarly I have not seen Dr. Applegate's records for my review today as well.  Patient also has some numbness in her hands but they are it is not as bad.  She denies significant weakness in the hands or diminished fine motor skills.  She does not trip or fall.  She has not tried other medications besides gabapentin for neuropathic pain.  She denies any recent loss of weight, decreased appetite, fever or chronic cough.  She denies history of diabetes or heavy alcohol intake    ROS:   14 system review of systems is positive for those listed in HPI and all other systems negative  PMH:  Past Medical History:  Diagnosis Date  . Arthritis   . Cancer (HCC)    Skin-Squamous cell  . Complication of anesthesia   . Hypertension   . Hyperthyroidism   . Hypothyroidism   . Iron deficiency anemia   . Neuropathy   . PONV (postoperative  nausea and vomiting)   . Vitamin B 12 deficiency     Social History:  Social History   Socioeconomic History  . Marital status: Widowed    Spouse name: Not on file  . Number of children: Not on file  . Years of education: Not on file  . Highest education level: Not on file  Occupational History  . Not on file  Tobacco Use  . Smoking status: Never Smoker  . Smokeless tobacco: Never Used  Vaping Use  . Vaping Use: Never used  Substance and Sexual Activity  . Alcohol use: Never  . Drug use: Never  . Sexual activity: Not on file  Other Topics Concern  .  Not on file  Social History Narrative  . Not on file   Social Determinants of Health   Financial Resource Strain:   . Difficulty of Paying Living Expenses: Not on file  Food Insecurity:   . Worried About Charity fundraiser in the Last Year: Not on file  . Ran Out of Food in the Last Year: Not on file  Transportation Needs:   . Lack of Transportation (Medical): Not on file  . Lack of Transportation (Non-Medical): Not on file  Physical Activity:   . Days of Exercise per Week: Not on file  . Minutes of Exercise per Session: Not on file  Stress:   . Feeling of Stress : Not on file  Social Connections:   . Frequency of Communication with Friends and Family: Not on file  . Frequency of Social Gatherings with Friends and Family: Not on file  . Attends Religious Services: Not on file  . Active Member of Clubs or Organizations: Not on file  . Attends Archivist Meetings: Not on file  . Marital Status: Not on file  Intimate Partner Violence:   . Fear of Current or Ex-Partner: Not on file  . Emotionally Abused: Not on file  . Physically Abused: Not on file  . Sexually Abused: Not on file    Medications:   Current Outpatient Medications on File Prior to Visit  Medication Sig Dispense Refill  . aspirin 325 MG EC tablet Take 1 tablet (325 mg total) by mouth 2 (two) times daily. 30 tablet 0  . Biotin 5000 MCG TABS  Take 5,000 mcg by mouth daily.     . Calcium Carb-Cholecalciferol (CALCIUM 600+D3 PO) Take 1 tablet by mouth daily.    . ferrous sulfate 325 (65 FE) MG tablet Take 325 mg by mouth daily with breakfast.    . gabapentin (NEURONTIN) 300 MG capsule Take 600 mg by mouth 4 (four) times daily.     Marland Kitchen levothyroxine (SYNTHROID, LEVOTHROID) 88 MCG tablet Take 88 mcg by mouth daily before breakfast.    . Omega-3 Fatty Acids (FISH OIL) 1000 MG CAPS Take 1,000 mg by mouth daily.    . traZODone (DESYREL) 50 MG tablet Take 25-50 mg by mouth at bedtime.     . triamterene-hydrochlorothiazide (MAXZIDE-25) 37.5-25 MG tablet Take 1 tablet by mouth daily.     . vitamin B-12 (CYANOCOBALAMIN) 1000 MCG tablet Take 1,000 mcg by mouth daily.     No current facility-administered medications on file prior to visit.    Allergies:   Allergies  Allergen Reactions  . Prednisone Hives  . Sulfa Antibiotics Anaphylaxis and Rash    Throat swelling/fever    Physical Exam Today's Vitals   12/24/19 1026  BP: (!) 157/69  Pulse: 74  Weight: 152 lb (68.9 kg)  Height: 5\' 4"  (1.626 m)   Body mass index is 26.09 kg/m.  General: frail very pleasant elderly Caucasian lady, seated, in no evident distress Head: head normocephalic and atraumatic.   Neck: supple with no carotid or supraclavicular bruits.  Surgical scar from prior thyroid surgery in the neck Cardiovascular: regular rate and rhythm, no murmurs Musculoskeletal: Bilateral hand arthritic nodules; increased tenderness right hand carpal bones; inability to completely flex bilateral pointer fingers Skin:  no rash/petichiae Vascular:  Normal pulses all extremities  Neurologic Exam Mental Status: Awake and fully alert.  Fluent speech and language.  Oriented to place and time. Recent and remote memory intact. Attention span, concentration and fund of knowledge  appropriate. Mood and affect appropriate.  Cranial Nerves: pupils equal, briskly reactive to light. Extraocular  movements full without nystagmus. Visual fields full to confrontation. Hearing intact. Facial sensation intact. Face, tongue, palate moves normally and symmetrically.  Motor: Normal bulk and tone. Normal strength in all tested extremity muscles. Sensory.: diminished vibratory sensation bilaterally from ankle down and diminished pinprick sensation bilaterally from waist to fingertips Coordination: Rapid alternating movements normal in all extremities. Finger-to-nose and heel-to-shin performed accurately bilaterally. Gait and Station: Arises from chair without difficulty. Stance is normal. Gait demonstrates normal stride length and balance with use of cane.  She is unable to heel, toe and tandem walk  .  Reflexes: 1+ and symmetric except both ankle jerks are absent.. Toes downgoing.     ASSESSMENT/PLAN: 38 lady with chronic sensory peripheral neuropathy with recent flareup and her lower extremity paresthesias of unclear etiology.  Reports recent right pointer finger cramping with sensation of catching into hyperextension   Sensory neuropathy -Overall stable -Continue gabapentin 300/300/600 -Inability to tolerate higher dose of gabapentin and discussed possible need of initiating adjunctive therapy or switching current therapy regimen if symptoms worsen -Discussed use of cane at all times for fall prevention  Right finger pain -Not a neurological or neuropathy issue and likely due to tendon or ligament issues -Advised to trial over-the-counter topical Aspercreme or Voltaren gel but if symptoms persist to discuss further with PCP and may need further evaluation by orthopedics or hand specialist   Follow-up in 6 months or call earlier if needed   I spent 25 minutes of face-to-face and non-face-to-face time with patient and son.  This included previsit chart review, lab review, study review, order entry, electronic health record documentation, patient education and discussion regarding sensory  neuropathy with ongoing use of gabapentin, right finger pain complaints and likely etiology and answered all her questions to patient and son satisfaction  Frann Rider, AGNP-BC  [facility] [address] [place], [place] [address]  Phone [phone] Fax [phone] Note: This document was prepared with digital dictation and possible smart phrase technology. Any transcriptional errors that result from this process are unintentional.

## 2019-12-26 NOTE — Progress Notes (Signed)
I agree with the above plan 

## 2020-01-15 DIAGNOSIS — D485 Neoplasm of uncertain behavior of skin: Secondary | ICD-10-CM | POA: Diagnosis not present

## 2020-01-15 DIAGNOSIS — L299 Pruritus, unspecified: Secondary | ICD-10-CM | POA: Diagnosis not present

## 2020-01-15 DIAGNOSIS — L300 Nummular dermatitis: Secondary | ICD-10-CM | POA: Diagnosis not present

## 2020-01-15 DIAGNOSIS — L308 Other specified dermatitis: Secondary | ICD-10-CM | POA: Diagnosis not present

## 2020-01-20 DIAGNOSIS — M9903 Segmental and somatic dysfunction of lumbar region: Secondary | ICD-10-CM | POA: Diagnosis not present

## 2020-01-20 DIAGNOSIS — M5136 Other intervertebral disc degeneration, lumbar region: Secondary | ICD-10-CM | POA: Diagnosis not present

## 2020-01-20 DIAGNOSIS — M5442 Lumbago with sciatica, left side: Secondary | ICD-10-CM | POA: Diagnosis not present

## 2020-01-20 DIAGNOSIS — M5137 Other intervertebral disc degeneration, lumbosacral region: Secondary | ICD-10-CM | POA: Diagnosis not present

## 2020-01-20 DIAGNOSIS — M9904 Segmental and somatic dysfunction of sacral region: Secondary | ICD-10-CM | POA: Diagnosis not present

## 2020-02-17 DIAGNOSIS — M5137 Other intervertebral disc degeneration, lumbosacral region: Secondary | ICD-10-CM | POA: Diagnosis not present

## 2020-02-17 DIAGNOSIS — M5136 Other intervertebral disc degeneration, lumbar region: Secondary | ICD-10-CM | POA: Diagnosis not present

## 2020-02-17 DIAGNOSIS — M9903 Segmental and somatic dysfunction of lumbar region: Secondary | ICD-10-CM | POA: Diagnosis not present

## 2020-02-17 DIAGNOSIS — M5442 Lumbago with sciatica, left side: Secondary | ICD-10-CM | POA: Diagnosis not present

## 2020-02-17 DIAGNOSIS — M9904 Segmental and somatic dysfunction of sacral region: Secondary | ICD-10-CM | POA: Diagnosis not present

## 2020-02-23 DIAGNOSIS — L300 Nummular dermatitis: Secondary | ICD-10-CM | POA: Diagnosis not present

## 2020-03-23 DIAGNOSIS — M9903 Segmental and somatic dysfunction of lumbar region: Secondary | ICD-10-CM | POA: Diagnosis not present

## 2020-03-23 DIAGNOSIS — M5136 Other intervertebral disc degeneration, lumbar region: Secondary | ICD-10-CM | POA: Diagnosis not present

## 2020-03-23 DIAGNOSIS — M5137 Other intervertebral disc degeneration, lumbosacral region: Secondary | ICD-10-CM | POA: Diagnosis not present

## 2020-03-23 DIAGNOSIS — M9904 Segmental and somatic dysfunction of sacral region: Secondary | ICD-10-CM | POA: Diagnosis not present

## 2020-03-23 DIAGNOSIS — M5442 Lumbago with sciatica, left side: Secondary | ICD-10-CM | POA: Diagnosis not present

## 2020-03-24 DIAGNOSIS — G629 Polyneuropathy, unspecified: Secondary | ICD-10-CM | POA: Diagnosis not present

## 2020-03-24 DIAGNOSIS — I10 Essential (primary) hypertension: Secondary | ICD-10-CM | POA: Diagnosis not present

## 2020-03-24 DIAGNOSIS — B372 Candidiasis of skin and nail: Secondary | ICD-10-CM | POA: Diagnosis not present

## 2020-03-24 DIAGNOSIS — Z79899 Other long term (current) drug therapy: Secondary | ICD-10-CM | POA: Diagnosis not present

## 2020-03-24 DIAGNOSIS — Z6825 Body mass index (BMI) 25.0-25.9, adult: Secondary | ICD-10-CM | POA: Diagnosis not present

## 2020-03-24 DIAGNOSIS — R350 Frequency of micturition: Secondary | ICD-10-CM | POA: Diagnosis not present

## 2020-03-24 DIAGNOSIS — L309 Dermatitis, unspecified: Secondary | ICD-10-CM | POA: Diagnosis not present

## 2020-03-24 DIAGNOSIS — H6123 Impacted cerumen, bilateral: Secondary | ICD-10-CM | POA: Diagnosis not present

## 2020-03-24 DIAGNOSIS — E039 Hypothyroidism, unspecified: Secondary | ICD-10-CM | POA: Diagnosis not present

## 2020-04-20 DIAGNOSIS — M5136 Other intervertebral disc degeneration, lumbar region: Secondary | ICD-10-CM | POA: Diagnosis not present

## 2020-04-20 DIAGNOSIS — M5137 Other intervertebral disc degeneration, lumbosacral region: Secondary | ICD-10-CM | POA: Diagnosis not present

## 2020-04-20 DIAGNOSIS — M5442 Lumbago with sciatica, left side: Secondary | ICD-10-CM | POA: Diagnosis not present

## 2020-04-20 DIAGNOSIS — M9904 Segmental and somatic dysfunction of sacral region: Secondary | ICD-10-CM | POA: Diagnosis not present

## 2020-04-20 DIAGNOSIS — M9903 Segmental and somatic dysfunction of lumbar region: Secondary | ICD-10-CM | POA: Diagnosis not present

## 2020-04-29 DIAGNOSIS — L300 Nummular dermatitis: Secondary | ICD-10-CM | POA: Diagnosis not present

## 2020-04-29 DIAGNOSIS — L299 Pruritus, unspecified: Secondary | ICD-10-CM | POA: Diagnosis not present

## 2020-04-29 DIAGNOSIS — L820 Inflamed seborrheic keratosis: Secondary | ICD-10-CM | POA: Diagnosis not present

## 2020-04-29 DIAGNOSIS — L853 Xerosis cutis: Secondary | ICD-10-CM | POA: Diagnosis not present

## 2020-05-18 DIAGNOSIS — M9904 Segmental and somatic dysfunction of sacral region: Secondary | ICD-10-CM | POA: Diagnosis not present

## 2020-05-18 DIAGNOSIS — M9903 Segmental and somatic dysfunction of lumbar region: Secondary | ICD-10-CM | POA: Diagnosis not present

## 2020-05-18 DIAGNOSIS — M5137 Other intervertebral disc degeneration, lumbosacral region: Secondary | ICD-10-CM | POA: Diagnosis not present

## 2020-05-18 DIAGNOSIS — M5136 Other intervertebral disc degeneration, lumbar region: Secondary | ICD-10-CM | POA: Diagnosis not present

## 2020-05-18 DIAGNOSIS — M5442 Lumbago with sciatica, left side: Secondary | ICD-10-CM | POA: Diagnosis not present

## 2020-06-15 DIAGNOSIS — M9904 Segmental and somatic dysfunction of sacral region: Secondary | ICD-10-CM | POA: Diagnosis not present

## 2020-06-15 DIAGNOSIS — M5136 Other intervertebral disc degeneration, lumbar region: Secondary | ICD-10-CM | POA: Diagnosis not present

## 2020-06-15 DIAGNOSIS — M5137 Other intervertebral disc degeneration, lumbosacral region: Secondary | ICD-10-CM | POA: Diagnosis not present

## 2020-06-15 DIAGNOSIS — M9903 Segmental and somatic dysfunction of lumbar region: Secondary | ICD-10-CM | POA: Diagnosis not present

## 2020-06-15 DIAGNOSIS — M5442 Lumbago with sciatica, left side: Secondary | ICD-10-CM | POA: Diagnosis not present

## 2020-06-30 ENCOUNTER — Ambulatory Visit: Payer: PPO | Admitting: Adult Health

## 2020-07-06 DIAGNOSIS — M5137 Other intervertebral disc degeneration, lumbosacral region: Secondary | ICD-10-CM | POA: Diagnosis not present

## 2020-07-06 DIAGNOSIS — M5442 Lumbago with sciatica, left side: Secondary | ICD-10-CM | POA: Diagnosis not present

## 2020-07-06 DIAGNOSIS — M9903 Segmental and somatic dysfunction of lumbar region: Secondary | ICD-10-CM | POA: Diagnosis not present

## 2020-07-06 DIAGNOSIS — M5136 Other intervertebral disc degeneration, lumbar region: Secondary | ICD-10-CM | POA: Diagnosis not present

## 2020-07-06 DIAGNOSIS — M9904 Segmental and somatic dysfunction of sacral region: Secondary | ICD-10-CM | POA: Diagnosis not present

## 2020-07-19 DIAGNOSIS — L299 Pruritus, unspecified: Secondary | ICD-10-CM | POA: Diagnosis not present

## 2020-07-19 DIAGNOSIS — L853 Xerosis cutis: Secondary | ICD-10-CM | POA: Diagnosis not present

## 2020-07-19 DIAGNOSIS — L570 Actinic keratosis: Secondary | ICD-10-CM | POA: Diagnosis not present

## 2020-07-19 DIAGNOSIS — L209 Atopic dermatitis, unspecified: Secondary | ICD-10-CM | POA: Diagnosis not present

## 2020-08-03 DIAGNOSIS — M5137 Other intervertebral disc degeneration, lumbosacral region: Secondary | ICD-10-CM | POA: Diagnosis not present

## 2020-08-03 DIAGNOSIS — M5136 Other intervertebral disc degeneration, lumbar region: Secondary | ICD-10-CM | POA: Diagnosis not present

## 2020-08-03 DIAGNOSIS — M9903 Segmental and somatic dysfunction of lumbar region: Secondary | ICD-10-CM | POA: Diagnosis not present

## 2020-08-03 DIAGNOSIS — M5442 Lumbago with sciatica, left side: Secondary | ICD-10-CM | POA: Diagnosis not present

## 2020-08-03 DIAGNOSIS — M9904 Segmental and somatic dysfunction of sacral region: Secondary | ICD-10-CM | POA: Diagnosis not present

## 2020-08-12 DIAGNOSIS — Z20828 Contact with and (suspected) exposure to other viral communicable diseases: Secondary | ICD-10-CM | POA: Diagnosis not present

## 2020-08-12 DIAGNOSIS — R070 Pain in throat: Secondary | ICD-10-CM | POA: Diagnosis not present

## 2020-08-16 DIAGNOSIS — L299 Pruritus, unspecified: Secondary | ICD-10-CM | POA: Diagnosis not present

## 2020-08-16 DIAGNOSIS — L209 Atopic dermatitis, unspecified: Secondary | ICD-10-CM | POA: Diagnosis not present

## 2020-08-31 DIAGNOSIS — M9904 Segmental and somatic dysfunction of sacral region: Secondary | ICD-10-CM | POA: Diagnosis not present

## 2020-08-31 DIAGNOSIS — M5136 Other intervertebral disc degeneration, lumbar region: Secondary | ICD-10-CM | POA: Diagnosis not present

## 2020-08-31 DIAGNOSIS — M5137 Other intervertebral disc degeneration, lumbosacral region: Secondary | ICD-10-CM | POA: Diagnosis not present

## 2020-08-31 DIAGNOSIS — M5442 Lumbago with sciatica, left side: Secondary | ICD-10-CM | POA: Diagnosis not present

## 2020-08-31 DIAGNOSIS — M9903 Segmental and somatic dysfunction of lumbar region: Secondary | ICD-10-CM | POA: Diagnosis not present

## 2020-09-14 DIAGNOSIS — Z1331 Encounter for screening for depression: Secondary | ICD-10-CM | POA: Diagnosis not present

## 2020-09-14 DIAGNOSIS — I10 Essential (primary) hypertension: Secondary | ICD-10-CM | POA: Diagnosis not present

## 2020-09-14 DIAGNOSIS — G629 Polyneuropathy, unspecified: Secondary | ICD-10-CM | POA: Diagnosis not present

## 2020-09-14 DIAGNOSIS — Z79899 Other long term (current) drug therapy: Secondary | ICD-10-CM | POA: Diagnosis not present

## 2020-09-14 DIAGNOSIS — Z6826 Body mass index (BMI) 26.0-26.9, adult: Secondary | ICD-10-CM | POA: Diagnosis not present

## 2020-09-14 DIAGNOSIS — L309 Dermatitis, unspecified: Secondary | ICD-10-CM | POA: Diagnosis not present

## 2020-09-14 DIAGNOSIS — E039 Hypothyroidism, unspecified: Secondary | ICD-10-CM | POA: Diagnosis not present

## 2020-09-14 DIAGNOSIS — Z0000 Encounter for general adult medical examination without abnormal findings: Secondary | ICD-10-CM | POA: Diagnosis not present

## 2020-09-27 DIAGNOSIS — L299 Pruritus, unspecified: Secondary | ICD-10-CM | POA: Diagnosis not present

## 2020-09-27 DIAGNOSIS — L209 Atopic dermatitis, unspecified: Secondary | ICD-10-CM | POA: Diagnosis not present

## 2020-09-28 DIAGNOSIS — M9903 Segmental and somatic dysfunction of lumbar region: Secondary | ICD-10-CM | POA: Diagnosis not present

## 2020-09-28 DIAGNOSIS — M5442 Lumbago with sciatica, left side: Secondary | ICD-10-CM | POA: Diagnosis not present

## 2020-09-28 DIAGNOSIS — M5136 Other intervertebral disc degeneration, lumbar region: Secondary | ICD-10-CM | POA: Diagnosis not present

## 2020-09-28 DIAGNOSIS — M5137 Other intervertebral disc degeneration, lumbosacral region: Secondary | ICD-10-CM | POA: Diagnosis not present

## 2020-09-28 DIAGNOSIS — M9904 Segmental and somatic dysfunction of sacral region: Secondary | ICD-10-CM | POA: Diagnosis not present

## 2020-10-09 DIAGNOSIS — Z20828 Contact with and (suspected) exposure to other viral communicable diseases: Secondary | ICD-10-CM | POA: Diagnosis not present

## 2020-10-18 ENCOUNTER — Ambulatory Visit: Payer: PPO | Admitting: Adult Health

## 2020-10-21 DIAGNOSIS — Z79899 Other long term (current) drug therapy: Secondary | ICD-10-CM | POA: Diagnosis not present

## 2020-10-26 DIAGNOSIS — M9904 Segmental and somatic dysfunction of sacral region: Secondary | ICD-10-CM | POA: Diagnosis not present

## 2020-10-26 DIAGNOSIS — M5137 Other intervertebral disc degeneration, lumbosacral region: Secondary | ICD-10-CM | POA: Diagnosis not present

## 2020-10-26 DIAGNOSIS — M9903 Segmental and somatic dysfunction of lumbar region: Secondary | ICD-10-CM | POA: Diagnosis not present

## 2020-10-26 DIAGNOSIS — M5136 Other intervertebral disc degeneration, lumbar region: Secondary | ICD-10-CM | POA: Diagnosis not present

## 2020-10-26 DIAGNOSIS — M5442 Lumbago with sciatica, left side: Secondary | ICD-10-CM | POA: Diagnosis not present

## 2020-11-23 DIAGNOSIS — M5137 Other intervertebral disc degeneration, lumbosacral region: Secondary | ICD-10-CM | POA: Diagnosis not present

## 2020-11-23 DIAGNOSIS — M5136 Other intervertebral disc degeneration, lumbar region: Secondary | ICD-10-CM | POA: Diagnosis not present

## 2020-11-23 DIAGNOSIS — M9903 Segmental and somatic dysfunction of lumbar region: Secondary | ICD-10-CM | POA: Diagnosis not present

## 2020-11-23 DIAGNOSIS — M5442 Lumbago with sciatica, left side: Secondary | ICD-10-CM | POA: Diagnosis not present

## 2020-11-23 DIAGNOSIS — M9904 Segmental and somatic dysfunction of sacral region: Secondary | ICD-10-CM | POA: Diagnosis not present

## 2020-12-21 DIAGNOSIS — M9903 Segmental and somatic dysfunction of lumbar region: Secondary | ICD-10-CM | POA: Diagnosis not present

## 2020-12-21 DIAGNOSIS — M5442 Lumbago with sciatica, left side: Secondary | ICD-10-CM | POA: Diagnosis not present

## 2020-12-21 DIAGNOSIS — M9904 Segmental and somatic dysfunction of sacral region: Secondary | ICD-10-CM | POA: Diagnosis not present

## 2020-12-21 DIAGNOSIS — M5136 Other intervertebral disc degeneration, lumbar region: Secondary | ICD-10-CM | POA: Diagnosis not present

## 2020-12-21 DIAGNOSIS — M5137 Other intervertebral disc degeneration, lumbosacral region: Secondary | ICD-10-CM | POA: Diagnosis not present

## 2021-01-16 ENCOUNTER — Ambulatory Visit: Payer: PPO | Admitting: Adult Health

## 2021-01-18 DIAGNOSIS — M9904 Segmental and somatic dysfunction of sacral region: Secondary | ICD-10-CM | POA: Diagnosis not present

## 2021-01-18 DIAGNOSIS — M5136 Other intervertebral disc degeneration, lumbar region: Secondary | ICD-10-CM | POA: Diagnosis not present

## 2021-01-18 DIAGNOSIS — M5442 Lumbago with sciatica, left side: Secondary | ICD-10-CM | POA: Diagnosis not present

## 2021-01-18 DIAGNOSIS — M5137 Other intervertebral disc degeneration, lumbosacral region: Secondary | ICD-10-CM | POA: Diagnosis not present

## 2021-01-18 DIAGNOSIS — M9903 Segmental and somatic dysfunction of lumbar region: Secondary | ICD-10-CM | POA: Diagnosis not present

## 2021-01-23 ENCOUNTER — Other Ambulatory Visit: Payer: Self-pay | Admitting: Adult Health

## 2021-01-26 DIAGNOSIS — L299 Pruritus, unspecified: Secondary | ICD-10-CM | POA: Diagnosis not present

## 2021-01-26 DIAGNOSIS — L209 Atopic dermatitis, unspecified: Secondary | ICD-10-CM | POA: Diagnosis not present

## 2021-01-26 DIAGNOSIS — D485 Neoplasm of uncertain behavior of skin: Secondary | ICD-10-CM | POA: Diagnosis not present

## 2021-02-15 DIAGNOSIS — M5442 Lumbago with sciatica, left side: Secondary | ICD-10-CM | POA: Diagnosis not present

## 2021-02-15 DIAGNOSIS — M9903 Segmental and somatic dysfunction of lumbar region: Secondary | ICD-10-CM | POA: Diagnosis not present

## 2021-02-15 DIAGNOSIS — M9904 Segmental and somatic dysfunction of sacral region: Secondary | ICD-10-CM | POA: Diagnosis not present

## 2021-02-15 DIAGNOSIS — M5137 Other intervertebral disc degeneration, lumbosacral region: Secondary | ICD-10-CM | POA: Diagnosis not present

## 2021-02-15 DIAGNOSIS — M5136 Other intervertebral disc degeneration, lumbar region: Secondary | ICD-10-CM | POA: Diagnosis not present

## 2021-02-23 ENCOUNTER — Encounter: Payer: Self-pay | Admitting: Adult Health

## 2021-02-23 ENCOUNTER — Ambulatory Visit: Payer: PPO | Admitting: Adult Health

## 2021-02-23 VITALS — BP 123/64 | HR 72 | Ht 64.0 in | Wt 151.0 lb

## 2021-02-23 DIAGNOSIS — G4762 Sleep related leg cramps: Secondary | ICD-10-CM

## 2021-02-23 DIAGNOSIS — G629 Polyneuropathy, unspecified: Secondary | ICD-10-CM

## 2021-02-23 NOTE — Progress Notes (Signed)
[facility] [address] [place]. [place] [address] [phone]       OFFICE FOLLOW UP NOTE  Patricia Mason Date of Birth:  [birth year] Medical Record Number:  [id number]   Referring MD:  Haydee Salter, NP  Reason for Referral:  Sensory neuropathy   Chief Complaint  Patricia Mason presents with   Follow-up    Rm 3 with [name] spouse  Pt is well, having a lot of cramps in extremities which has worsen since last visit.       HPI:   Update 02/23/2021 JM: Returns for follow-up after prior visit over 1 year ago accompanied by Patricia Mason husband, [name]  C/o nocturnal leg cramps R>L usually on typically located anterior and lateral muscles distally. Occurs 1-2x per week but will awaken Patricia Mason from Patricia Mason sleep after changing position.  After ambulating, seems to subside but will restart after laying back down. These are painful - more than just a "discomfort".  C/o left hand cramping after certain movement such as styling Patricia Mason hair. Typically will not last long  Mild progression of neuropathy with slight increase of numbness in feet bilaterally but continues to deny any nerve pain sensation.  She has remained on gabapentin 300/300/600 -denies side effects.  No further concerns at this time    History provided for reference purposes only Update 12/24/2019 JM: Patricia Mason returns for 85-month follow-up accompanied by Patricia Mason son for sensory neuropathy.  Neuropathy has been stable since prior visit with ongoing use of gabapentin 300/300/600 tolerating dosage well without side effects.  She continues to have numbness in Patricia Mason feet and right leg cramping (present since knee surgery) but denies any painful symptoms such as burning, pinprick or electrical shock sensations.  Continues to use a cane for ambulation and denies any recent falls.  She does report right pointer finger catching sensation with at times wanting to hyperextend.  Reports occasional cramping sensation but otherwise denies pain.  She is  unsure if related to arthritis as she has extensive bilateral hand arthritis and inability to fully flex right pointer finger which is chronic.  She continues to follow with Dr. Saintclair Halsted for chronic lumbar disease.  No further concerns.  Update 06/22/2019 Dr. Leonie Man : She returns for follow-up after last visit with me 12 months ago.  She is accompanied by Patricia Mason son.  Patricia Mason states she did not tolerate increasing the gabapentin to 600 times daily as it made Patricia Mason have some palpitations as well as she developed constipation.  She reduce the dose back to 4 tablets a day and is doing better.  She still has numbness in the feet particularly when she is on Patricia Mason feet and has been walking for some time.  She denies burning or severe pain.  She states she is gotten used to this.  She was previously not able to tolerate Topamax due to side effects also. She does not want to try Lyrica or alternative agent at this time.  She underwent EMG nerve conduction study done 04/06/2019 by Dr. Jannifer Franklin which confirm sensory peripheral polyneuropathy.  She is has been bothered by back problems with a pinched nerve and she does see Dr. Saintclair Halsted who operated on Patricia Mason neck in the past.  She also is planning on having MRI of Patricia Mason back done.  Update 03/31/2019 Dr. Leonie Man:  Patricia Mason returns for follow-up after last visit 6 months ago.  She is accompanied by Patricia Mason son.  She states she was not able to tolerate Topamax which is started  at last visit because of dizziness.  She was only on a small dose of 50 mg twice daily and discontinued it.  She remains on gabapentin and the current dose of 300 mg 3 times daily which is tolerating well and states she is never been on a higher dose but still complains of significant paresthesias and numbness in Patricia Mason feet.  This is constant and bothersome.  She had neuropathy panel labs done on 09/15/2018 all of which were normal.  I had requested the EMG nerve conduction study but for unclear reason that has not yet been scheduled.  She  did have surgery for left knee replacement on 10/13/2018 which went well.  She has no new complaints today.  I am yet to receive records from Dr. Kary Kos and Dr. Applegate's office   Initial visit 09/15/18 Dr. Leonie Man : Patricia Mason is a 87 year old pleasant Caucasian lady seen today for initial office consultation visit for neuropathy.  History is obtained from the Patricia Mason as well as referral notes and from Patricia Mason son was present for this visit.  She has a past medical history of hypertension, degenerative disc disease, hypothyroidism basal cell carcinoma of the nose.  States she has a 10-year history of sensory peripheral neuropathy.  She describes this as discomfort in Patricia Mason feet from the ankle down bilaterally.  Over the last 6 years she was being followed at Dr. Legrand Como Applegate's neurology clinic in [place] but for the last couple of years since he left this practice she has had no neurological follow-up.  She apparently had a EMG nerve conduction study done in September 2015 which showed absent sural sensory responses but I do not have that report to review.  She states in the last 3 months or so she is noticed increased and worsening particularly in Patricia Mason right leg.  She describes discomfort now present throughout the day but more noticeable when she is is on Patricia Mason feet or at night.  At times she has trouble sleeping.  She is currently taking Neurontin 300 mg 3 times daily which previously used to work well but there is no longer keeping Patricia Mason discomfort under control.  She also admits to balance being slightly off however she has had no major falls.  She can get by indoors without any help but does use a cane for outdoors.  She was recently seen by Dr. Dominica Severin cram neurosurgeon who apparently did some work-up for degenerative back disease but I do not have his records for my review today.  Similarly I have not seen Dr. Applegate's records for my review today as well.  Patricia Mason also has some numbness in Patricia Mason hands but they  are it is not as bad.  She denies significant weakness in the hands or diminished fine motor skills.  She does not trip or fall.  She has not tried other medications besides gabapentin for neuropathic pain.  She denies any recent loss of weight, decreased appetite, fever or chronic cough.  She denies history of diabetes or heavy alcohol intake    ROS:   14 system review of systems is positive for those listed in HPI and all other systems negative  PMH:  Past Medical History:  Diagnosis Date   Arthritis    Cancer ([place])    Skin-Squamous cell   Complication of anesthesia    Hypertension    Hyperthyroidism    Hypothyroidism    Iron deficiency anemia    Neuropathy    PONV (postoperative nausea and vomiting)  Vitamin B 12 deficiency     Social History:  Social History   Socioeconomic History   Marital status: Widowed    Spouse name: Not on file   Number of children: Not on file   Years of education: Not on file   Highest education level: Not on file  Occupational History   Not on file  Tobacco Use   Smoking status: Never   Smokeless tobacco: Never  Vaping Use   Vaping Use: Never used  Substance and Sexual Activity   Alcohol use: Never   Drug use: Never   Sexual activity: Not on file  Other Topics Concern   Not on file  Social History Narrative   Not on file   Social Determinants of Health   Financial Resource Strain: Not on file  Food Insecurity: Not on file  Transportation Needs: Not on file  Physical Activity: Not on file  Stress: Not on file  Social Connections: Not on file  Intimate Partner Violence: Not on file    Medications:   Current Outpatient Medications on File Prior to Visit  Medication Sig Dispense Refill   aspirin 325 MG EC tablet Take 1 tablet (325 mg total) by mouth 2 (two) times daily. 30 tablet 0   Biotin 5000 MCG TABS Take 5,000 mcg by mouth daily.      Calcium Carb-Cholecalciferol (CALCIUM 600+D3 PO) Take 1 tablet by mouth daily.      gabapentin (NEURONTIN) 300 MG capsule TAKE 1 CAPSULE BY MOUTH IN THE MORNING, 1 CAPSULE IN THE AFTERNOON, AND 2 CAPSULES IN THE EVENING. 360 capsule 3   levothyroxine (SYNTHROID, LEVOTHROID) 88 MCG tablet Take 88 mcg by mouth daily before breakfast.     MYCOPHENOLATE MOFETIL PO Take 500 mg by mouth 3 (three) times daily.     Omega-3 Fatty Acids (FISH OIL) 1000 MG CAPS Take 1,000 mg by mouth daily.     triamterene-hydrochlorothiazide (MAXZIDE-25) 37.5-25 MG tablet Take 1 tablet by mouth daily.      vitamin B-12 (CYANOCOBALAMIN) 1000 MCG tablet Take 1,000 mcg by mouth daily.     No current facility-administered medications on file prior to visit.    Allergies:   Allergies  Allergen Reactions   Prednisone Hives   Sulfa Antibiotics Anaphylaxis and Rash    Throat swelling/fever    Physical Exam Today's Vitals   02/23/21 1235  BP: 123/64  Pulse: 72  Weight: 151 lb (68.5 kg)  Height: 5\' 4"  (1.626 m)    Body mass index is 25.92 kg/m.  General: frail very pleasant elderly Caucasian lady, seated, in no evident distress Head: head normocephalic and atraumatic.   Neck: supple with no carotid or supraclavicular bruits.  Surgical scar from prior thyroid surgery in the neck Cardiovascular: regular rate and rhythm, no murmurs Musculoskeletal: Bilateral hand arthritic nodules Skin:  no rash/petichiae Vascular:  Normal pulses all extremities  Neurologic Exam Mental Status: Awake and fully alert.  Fluent speech and language.  Oriented to place and time. Recent and remote memory intact. Attention span, concentration and fund of knowledge appropriate. Mood and affect appropriate.  Cranial Nerves: pupils equal, briskly reactive to light. Extraocular movements full without nystagmus. Visual fields full to confrontation. Hearing intact. Facial sensation intact. Face, tongue, palate moves normally and symmetrically.  Motor: Normal bulk and tone. Normal strength in all tested extremity  muscles. Sensory.: diminished vibratory and light touch sensation bilaterally from ankle down  Coordination: Rapid alternating movements normal in all extremities. Finger-to-nose and heel-to-shin performed  accurately bilaterally. Gait and Station: Arises from chair without difficulty. Stance is slightly hunched. Gait demonstrates decreased stride length and step height with mild unsteadiness without use of assistive device.  She is unable to heel, toe and tandem walk  .  Reflexes: 1+ and symmetric except both ankle jerks are absent.. Toes downgoing.     ASSESSMENT/PLAN: 38 lady with chronic sensory peripheral neuropathy with recent flareup and Patricia Mason lower extremity paresthesias of unclear etiology.  Also chronic c/o R>L LE nocturnal cramping   Sensory neuropathy -Overall stable -Continue gabapentin 300/300/600 -refill provided -Inability to tolerate higher dose of gabapentin and topiramate   Nocturnal leg cramps, L hand cramp -Chronic neuropathy possibly contributing -check CK and magnesium - provided lab slips - she plans on having these completed next week with PCP -Discussed importance of increasing hydration and can try magnesium supplement    Follow-up in 6 months with Dr. Leonie Man per Patricia Mason request or call earlier if needed   I spent 31 minutes of face-to-face and non-face-to-face time with Patricia Mason and husband.  This included previsit chart review, lab review, study review, order entry, electronic health record documentation, Patricia Mason and husband education and discussion regarding sensory neuropathy with ongoing use of gabapentin, nocturnal cramping and answered all Patricia Mason questions to Patricia Mason and husband's satisfaction  Frann Rider, AGNP-BC  [facility] [address] [place], [place] [address]  Phone [phone] Fax [phone] Note: This document was prepared with digital dictation and possible smart phrase technology. Any transcriptional  errors that result from this process are unintentional.

## 2021-02-23 NOTE — Patient Instructions (Addendum)
We will check lab work today to look for some possible causes of cramping  You can try a magnesium supplement to see if this helps and ensure you drink 64-100 oz of water per day  Continue gabapentin 300/300/600 for neuropathy       Followup in the future with me in 6 months with Dr. Leonie Man or call earlier if needed       Thank you for coming to see [place] at [facility]. I hope we have been able to provide you high quality care today.  You may receive a patient satisfaction survey over the next few weeks. We would appreciate your feedback and comments so that we may continue to improve ourselves and the health of our patients.  Leg Cramps Leg cramps occur when one or more muscles tighten and a person has no control over it (involuntary muscle contraction). Muscle cramps are most common in the calf muscles of the leg. They can occur during exercise or at rest. Leg cramps are painful, and they may last for a few seconds to a few minutes. Cramps may return several times before they finally stop. Usually, leg cramps are not caused by a serious medical problem. In many cases, the cause is not known. Some common causes include: Excessive physical effort (overexertion), such as during intense exercise. Doing the same motion over and over. Staying in a certain position for a long period of time. Improper preparation, form, or technique while doing a sport or an activity. Dehydration. Injury. Side effects of certain medicines. Abnormally low levels of minerals in your blood (electrolytes), especially potassium and calcium. This could result from: Pregnancy. Taking diuretic medicines. Follow these instructions at home: Eating and drinking Drink enough fluid to keep your urine pale yellow. Staying hydrated may help prevent cramps. Eat a healthy diet that includes plenty of nutrients to help your muscles function. A healthy diet includes fruits and vegetables, lean protein,  whole grains, and low-fat or nonfat dairy products. Managing pain, stiffness, and swelling   Try massaging, stretching, and relaxing the affected muscle. Do this for several minutes at a time. If directed, put ice on areas that are sore or painful after a cramp. To do this: Put ice in a plastic bag. Place a towel between your skin and the bag. Leave the ice on for 20 minutes, 2-3 times a day. Remove the ice if your skin turns bright red. This is very important. If you cannot feel pain, heat, or cold, you have a greater risk of damage to the area. If directed, apply heat to muscles that are tense or tight. Do this before you exercise, or as often as told by your health care provider. Use the heat source that your health care provider recommends, such as a moist heat pack or a heating pad. To do this: Place a towel between your skin and the heat source. Leave the heat on for 20-30 minutes. Remove the heat if your skin turns bright red. This is especially important if you are unable to feel pain, heat, or cold. You may have a greater risk of getting burned. Try taking hot showers or baths to help relax tight muscles. General instructions If you are having frequent leg cramps, avoid intense exercise for several days. Take over-the-counter and prescription medicines only as told by your health care provider. Keep all follow-up visits. This is important. Contact a health care provider if: Your leg cramps get more severe or more frequent, or they  do not improve over time. Your foot becomes cold, numb, or blue. Summary Muscle cramps can develop in any muscle, but the most common place is in the calf muscles of the leg. Leg cramps are painful, and they may last for a few seconds to a few minutes. Usually, leg cramps are not caused by a serious medical problem. Often, the cause is not known. Stay hydrated, and take over-the-counter and prescription medicines only as told by your health care  provider. This information is not intended to replace advice given to you by your health care provider. Make sure you discuss any questions you have with your health care provider. Document Revised: 07/22/2019 Document Reviewed: 07/22/2019 Elsevier Patient Education  [place].

## 2021-02-24 NOTE — Progress Notes (Signed)
I agree with the above plan 

## 2021-03-01 DIAGNOSIS — I10 Essential (primary) hypertension: Secondary | ICD-10-CM | POA: Diagnosis not present

## 2021-03-01 DIAGNOSIS — Z79899 Other long term (current) drug therapy: Secondary | ICD-10-CM | POA: Diagnosis not present

## 2021-03-01 DIAGNOSIS — R252 Cramp and spasm: Secondary | ICD-10-CM | POA: Diagnosis not present

## 2021-03-01 DIAGNOSIS — E039 Hypothyroidism, unspecified: Secondary | ICD-10-CM | POA: Diagnosis not present

## 2021-03-15 DIAGNOSIS — M9904 Segmental and somatic dysfunction of sacral region: Secondary | ICD-10-CM | POA: Diagnosis not present

## 2021-03-15 DIAGNOSIS — M9903 Segmental and somatic dysfunction of lumbar region: Secondary | ICD-10-CM | POA: Diagnosis not present

## 2021-03-15 DIAGNOSIS — M5136 Other intervertebral disc degeneration, lumbar region: Secondary | ICD-10-CM | POA: Diagnosis not present

## 2021-03-15 DIAGNOSIS — M5442 Lumbago with sciatica, left side: Secondary | ICD-10-CM | POA: Diagnosis not present

## 2021-03-15 DIAGNOSIS — M5137 Other intervertebral disc degeneration, lumbosacral region: Secondary | ICD-10-CM | POA: Diagnosis not present

## 2021-03-21 DIAGNOSIS — L209 Atopic dermatitis, unspecified: Secondary | ICD-10-CM | POA: Diagnosis not present

## 2021-03-21 DIAGNOSIS — L853 Xerosis cutis: Secondary | ICD-10-CM | POA: Diagnosis not present

## 2021-03-21 DIAGNOSIS — L299 Pruritus, unspecified: Secondary | ICD-10-CM | POA: Diagnosis not present

## 2021-03-31 DIAGNOSIS — G629 Polyneuropathy, unspecified: Secondary | ICD-10-CM | POA: Diagnosis not present

## 2021-03-31 DIAGNOSIS — I10 Essential (primary) hypertension: Secondary | ICD-10-CM | POA: Diagnosis not present

## 2021-03-31 DIAGNOSIS — R252 Cramp and spasm: Secondary | ICD-10-CM | POA: Diagnosis not present

## 2021-03-31 DIAGNOSIS — L309 Dermatitis, unspecified: Secondary | ICD-10-CM | POA: Diagnosis not present

## 2021-03-31 DIAGNOSIS — M79602 Pain in left arm: Secondary | ICD-10-CM | POA: Diagnosis not present

## 2021-03-31 DIAGNOSIS — E039 Hypothyroidism, unspecified: Secondary | ICD-10-CM | POA: Diagnosis not present

## 2021-03-31 DIAGNOSIS — Z6825 Body mass index (BMI) 25.0-25.9, adult: Secondary | ICD-10-CM | POA: Diagnosis not present

## 2021-04-06 DIAGNOSIS — H18523 Epithelial (juvenile) corneal dystrophy, bilateral: Secondary | ICD-10-CM | POA: Diagnosis not present

## 2021-04-06 DIAGNOSIS — H353132 Nonexudative age-related macular degeneration, bilateral, intermediate dry stage: Secondary | ICD-10-CM | POA: Diagnosis not present

## 2021-04-10 DIAGNOSIS — J069 Acute upper respiratory infection, unspecified: Secondary | ICD-10-CM | POA: Diagnosis not present

## 2021-04-10 DIAGNOSIS — Z20828 Contact with and (suspected) exposure to other viral communicable diseases: Secondary | ICD-10-CM | POA: Diagnosis not present

## 2021-04-12 DIAGNOSIS — M9904 Segmental and somatic dysfunction of sacral region: Secondary | ICD-10-CM | POA: Diagnosis not present

## 2021-04-12 DIAGNOSIS — M5136 Other intervertebral disc degeneration, lumbar region: Secondary | ICD-10-CM | POA: Diagnosis not present

## 2021-04-12 DIAGNOSIS — M5137 Other intervertebral disc degeneration, lumbosacral region: Secondary | ICD-10-CM | POA: Diagnosis not present

## 2021-04-12 DIAGNOSIS — M9903 Segmental and somatic dysfunction of lumbar region: Secondary | ICD-10-CM | POA: Diagnosis not present

## 2021-04-12 DIAGNOSIS — M5442 Lumbago with sciatica, left side: Secondary | ICD-10-CM | POA: Diagnosis not present

## 2021-04-14 DIAGNOSIS — K140 Glossitis: Secondary | ICD-10-CM | POA: Diagnosis not present

## 2021-05-17 DIAGNOSIS — M5136 Other intervertebral disc degeneration, lumbar region: Secondary | ICD-10-CM | POA: Diagnosis not present

## 2021-05-17 DIAGNOSIS — M5442 Lumbago with sciatica, left side: Secondary | ICD-10-CM | POA: Diagnosis not present

## 2021-05-17 DIAGNOSIS — M5137 Other intervertebral disc degeneration, lumbosacral region: Secondary | ICD-10-CM | POA: Diagnosis not present

## 2021-05-17 DIAGNOSIS — M9903 Segmental and somatic dysfunction of lumbar region: Secondary | ICD-10-CM | POA: Diagnosis not present

## 2021-05-17 DIAGNOSIS — M9904 Segmental and somatic dysfunction of sacral region: Secondary | ICD-10-CM | POA: Diagnosis not present

## 2021-05-23 DIAGNOSIS — L299 Pruritus, unspecified: Secondary | ICD-10-CM | POA: Diagnosis not present

## 2021-05-23 DIAGNOSIS — L209 Atopic dermatitis, unspecified: Secondary | ICD-10-CM | POA: Diagnosis not present

## 2021-06-14 DIAGNOSIS — M5442 Lumbago with sciatica, left side: Secondary | ICD-10-CM | POA: Diagnosis not present

## 2021-06-14 DIAGNOSIS — M9904 Segmental and somatic dysfunction of sacral region: Secondary | ICD-10-CM | POA: Diagnosis not present

## 2021-06-14 DIAGNOSIS — M5136 Other intervertebral disc degeneration, lumbar region: Secondary | ICD-10-CM | POA: Diagnosis not present

## 2021-06-14 DIAGNOSIS — M5137 Other intervertebral disc degeneration, lumbosacral region: Secondary | ICD-10-CM | POA: Diagnosis not present

## 2021-06-14 DIAGNOSIS — M9903 Segmental and somatic dysfunction of lumbar region: Secondary | ICD-10-CM | POA: Diagnosis not present

## 2021-07-12 DIAGNOSIS — M5442 Lumbago with sciatica, left side: Secondary | ICD-10-CM | POA: Diagnosis not present

## 2021-07-12 DIAGNOSIS — M9904 Segmental and somatic dysfunction of sacral region: Secondary | ICD-10-CM | POA: Diagnosis not present

## 2021-07-12 DIAGNOSIS — M5137 Other intervertebral disc degeneration, lumbosacral region: Secondary | ICD-10-CM | POA: Diagnosis not present

## 2021-07-12 DIAGNOSIS — M9903 Segmental and somatic dysfunction of lumbar region: Secondary | ICD-10-CM | POA: Diagnosis not present

## 2021-07-12 DIAGNOSIS — M5136 Other intervertebral disc degeneration, lumbar region: Secondary | ICD-10-CM | POA: Diagnosis not present

## 2021-07-18 DIAGNOSIS — Z1231 Encounter for screening mammogram for malignant neoplasm of breast: Secondary | ICD-10-CM | POA: Diagnosis not present

## 2021-08-05 DIAGNOSIS — L209 Atopic dermatitis, unspecified: Secondary | ICD-10-CM | POA: Diagnosis not present

## 2021-08-05 DIAGNOSIS — L299 Pruritus, unspecified: Secondary | ICD-10-CM | POA: Diagnosis not present

## 2021-08-09 DIAGNOSIS — M9904 Segmental and somatic dysfunction of sacral region: Secondary | ICD-10-CM | POA: Diagnosis not present

## 2021-08-09 DIAGNOSIS — M5442 Lumbago with sciatica, left side: Secondary | ICD-10-CM | POA: Diagnosis not present

## 2021-08-09 DIAGNOSIS — M5137 Other intervertebral disc degeneration, lumbosacral region: Secondary | ICD-10-CM | POA: Diagnosis not present

## 2021-08-09 DIAGNOSIS — M9903 Segmental and somatic dysfunction of lumbar region: Secondary | ICD-10-CM | POA: Diagnosis not present

## 2021-08-09 DIAGNOSIS — M5136 Other intervertebral disc degeneration, lumbar region: Secondary | ICD-10-CM | POA: Diagnosis not present

## 2021-08-10 DIAGNOSIS — R928 Other abnormal and inconclusive findings on diagnostic imaging of breast: Secondary | ICD-10-CM | POA: Diagnosis not present

## 2021-08-10 DIAGNOSIS — R922 Inconclusive mammogram: Secondary | ICD-10-CM | POA: Diagnosis not present

## 2021-08-10 DIAGNOSIS — N6489 Other specified disorders of breast: Secondary | ICD-10-CM | POA: Diagnosis not present

## 2021-08-31 ENCOUNTER — Ambulatory Visit: Payer: PPO | Admitting: Neurology

## 2021-09-06 DIAGNOSIS — M5136 Other intervertebral disc degeneration, lumbar region: Secondary | ICD-10-CM | POA: Diagnosis not present

## 2021-09-06 DIAGNOSIS — M5442 Lumbago with sciatica, left side: Secondary | ICD-10-CM | POA: Diagnosis not present

## 2021-09-06 DIAGNOSIS — M9904 Segmental and somatic dysfunction of sacral region: Secondary | ICD-10-CM | POA: Diagnosis not present

## 2021-09-06 DIAGNOSIS — M5137 Other intervertebral disc degeneration, lumbosacral region: Secondary | ICD-10-CM | POA: Diagnosis not present

## 2021-09-06 DIAGNOSIS — M9903 Segmental and somatic dysfunction of lumbar region: Secondary | ICD-10-CM | POA: Diagnosis not present

## 2021-09-20 DIAGNOSIS — Z0000 Encounter for general adult medical examination without abnormal findings: Secondary | ICD-10-CM | POA: Diagnosis not present

## 2021-09-20 DIAGNOSIS — F5101 Primary insomnia: Secondary | ICD-10-CM | POA: Diagnosis not present

## 2021-09-20 DIAGNOSIS — F419 Anxiety disorder, unspecified: Secondary | ICD-10-CM | POA: Diagnosis not present

## 2021-09-20 DIAGNOSIS — Z79899 Other long term (current) drug therapy: Secondary | ICD-10-CM | POA: Diagnosis not present

## 2021-09-20 DIAGNOSIS — Z6825 Body mass index (BMI) 25.0-25.9, adult: Secondary | ICD-10-CM | POA: Diagnosis not present

## 2021-09-20 DIAGNOSIS — Z1331 Encounter for screening for depression: Secondary | ICD-10-CM | POA: Diagnosis not present

## 2021-09-20 DIAGNOSIS — E039 Hypothyroidism, unspecified: Secondary | ICD-10-CM | POA: Diagnosis not present

## 2021-09-20 DIAGNOSIS — I10 Essential (primary) hypertension: Secondary | ICD-10-CM | POA: Diagnosis not present

## 2021-09-20 DIAGNOSIS — G629 Polyneuropathy, unspecified: Secondary | ICD-10-CM | POA: Diagnosis not present

## 2021-10-04 DIAGNOSIS — M5136 Other intervertebral disc degeneration, lumbar region: Secondary | ICD-10-CM | POA: Diagnosis not present

## 2021-10-04 DIAGNOSIS — M5442 Lumbago with sciatica, left side: Secondary | ICD-10-CM | POA: Diagnosis not present

## 2021-10-04 DIAGNOSIS — M9904 Segmental and somatic dysfunction of sacral region: Secondary | ICD-10-CM | POA: Diagnosis not present

## 2021-10-04 DIAGNOSIS — M5137 Other intervertebral disc degeneration, lumbosacral region: Secondary | ICD-10-CM | POA: Diagnosis not present

## 2021-10-04 DIAGNOSIS — M9903 Segmental and somatic dysfunction of lumbar region: Secondary | ICD-10-CM | POA: Diagnosis not present

## 2021-10-24 DIAGNOSIS — J019 Acute sinusitis, unspecified: Secondary | ICD-10-CM | POA: Diagnosis not present

## 2021-10-26 DIAGNOSIS — H3530 Unspecified macular degeneration: Secondary | ICD-10-CM | POA: Diagnosis not present

## 2021-10-26 DIAGNOSIS — H353132 Nonexudative age-related macular degeneration, bilateral, intermediate dry stage: Secondary | ICD-10-CM | POA: Diagnosis not present

## 2021-10-31 DIAGNOSIS — L299 Pruritus, unspecified: Secondary | ICD-10-CM | POA: Diagnosis not present

## 2021-10-31 DIAGNOSIS — L209 Atopic dermatitis, unspecified: Secondary | ICD-10-CM | POA: Diagnosis not present

## 2021-11-01 DIAGNOSIS — M9903 Segmental and somatic dysfunction of lumbar region: Secondary | ICD-10-CM | POA: Diagnosis not present

## 2021-11-01 DIAGNOSIS — M5136 Other intervertebral disc degeneration, lumbar region: Secondary | ICD-10-CM | POA: Diagnosis not present

## 2021-11-01 DIAGNOSIS — M5137 Other intervertebral disc degeneration, lumbosacral region: Secondary | ICD-10-CM | POA: Diagnosis not present

## 2021-11-01 DIAGNOSIS — M9904 Segmental and somatic dysfunction of sacral region: Secondary | ICD-10-CM | POA: Diagnosis not present

## 2021-11-01 DIAGNOSIS — M5442 Lumbago with sciatica, left side: Secondary | ICD-10-CM | POA: Diagnosis not present

## 2021-11-29 DIAGNOSIS — M5137 Other intervertebral disc degeneration, lumbosacral region: Secondary | ICD-10-CM | POA: Diagnosis not present

## 2021-11-29 DIAGNOSIS — M9903 Segmental and somatic dysfunction of lumbar region: Secondary | ICD-10-CM | POA: Diagnosis not present

## 2021-11-29 DIAGNOSIS — M9904 Segmental and somatic dysfunction of sacral region: Secondary | ICD-10-CM | POA: Diagnosis not present

## 2021-11-29 DIAGNOSIS — M5442 Lumbago with sciatica, left side: Secondary | ICD-10-CM | POA: Diagnosis not present

## 2021-11-29 DIAGNOSIS — M5136 Other intervertebral disc degeneration, lumbar region: Secondary | ICD-10-CM | POA: Diagnosis not present

## 2021-12-27 DIAGNOSIS — M9904 Segmental and somatic dysfunction of sacral region: Secondary | ICD-10-CM | POA: Diagnosis not present

## 2021-12-27 DIAGNOSIS — M5442 Lumbago with sciatica, left side: Secondary | ICD-10-CM | POA: Diagnosis not present

## 2021-12-27 DIAGNOSIS — M5137 Other intervertebral disc degeneration, lumbosacral region: Secondary | ICD-10-CM | POA: Diagnosis not present

## 2021-12-27 DIAGNOSIS — M5136 Other intervertebral disc degeneration, lumbar region: Secondary | ICD-10-CM | POA: Diagnosis not present

## 2021-12-27 DIAGNOSIS — M9903 Segmental and somatic dysfunction of lumbar region: Secondary | ICD-10-CM | POA: Diagnosis not present

## 2022-01-01 ENCOUNTER — Ambulatory Visit: Payer: PPO | Admitting: Neurology

## 2022-01-01 ENCOUNTER — Encounter: Payer: Self-pay | Admitting: Neurology

## 2022-01-01 VITALS — BP 160/70 | HR 73 | Ht 64.6 in | Wt 148.8 lb

## 2022-01-01 DIAGNOSIS — R252 Cramp and spasm: Secondary | ICD-10-CM | POA: Diagnosis not present

## 2022-01-01 DIAGNOSIS — G629 Polyneuropathy, unspecified: Secondary | ICD-10-CM | POA: Diagnosis not present

## 2022-01-01 NOTE — Progress Notes (Signed)
[facility] [address] [place]. [place] [id number] [phone]       OFFICE FOLLOW UP NOTE  Patricia Mason Date of Birth:  12/07/1930 Medical Record Number:  [id number]   Referring MD:  Haydee Salter, NP  Reason for Referral:  Sensory neuropathy   Chief Complaint  Patient presents with   Follow-up    Rm 20.      HPI:  Update 01/01/2022 : She returns for follow-up after last visit in December 2022.  She is accompanied by her son today.  Patient continues to have cramps in her hands and feet.  These occur mostly at night 2 weeks or so.  They involve the lateral aspect of her legs and she is able to walk without.  Remains on gabapentin 300 mg morning, 300 and at night.  He has not been able to tolerate a higher dose in the past.  He also complains of a pulling sensation in the hand and wonders if this is related to her arthritis.  She ambulates with a cane states she is doing well.  She exercises at least 30 minutes by walking every day.  She has had no recent falls or injuries.  She states her blood pressure is under good control though today it is elevated 160/70.  She is tolerating well without bruising or bleeding.  Update 02/23/2021 JM: Returns for follow-up after prior visit over 1 year ago accompanied by her husband, [name]  C/o nocturnal leg cramps R>L usually on typically located anterior and lateral muscles distally. Occurs 1-2x per week but will awaken her from her sleep after changing position.  After ambulating, seems to subside but will restart after laying back down. These are painful - more than just a "discomfort".  C/o left hand cramping after certain movement such as styling her hair. Typically will not last long  Mild progression of neuropathy with slight increase of numbness in feet bilaterally but continues to deny any nerve pain sensation.  She has remained on gabapentin 300/300/600 -denies side effects.  No further concerns at this  time    History provided for reference purposes only Update 12/24/2019 JM: [name] returns for 86-monthfollow-up accompanied by her son for sensory neuropathy.  Neuropathy has been stable since prior visit with ongoing use of gabapentin 300/300/600 tolerating dosage well without side effects.  She continues to have numbness in her feet and right leg cramping (present since knee surgery) but denies any painful symptoms such as burning, pinprick or electrical shock sensations.  Continues to use a cane for ambulation and denies any recent falls.  She does report right pointer finger catching sensation with at times wanting to hyperextend.  Reports occasional cramping sensation but otherwise denies pain.  She is unsure if related to arthritis as she has extensive bilateral hand arthritis and inability to fully flex right pointer finger which is chronic.  She continues to follow with Dr. CSaintclair Halstedfor chronic lumbar disease.  No further concerns.  Update 06/22/2019 Dr. SLeonie Man: She returns for follow-up after last visit with me 12 months ago.  She is accompanied by her son.  Patient states she did not tolerate increasing the gabapentin to 600 times daily as it made her have some palpitations as well as she developed constipation.  She reduce the dose back to 4 tablets a day and is doing better.  She still has numbness in the feet particularly when she is on her feet and has been walking  for some time.  She denies burning or severe pain.  She states she is gotten used to this.  She was previously not able to tolerate Topamax due to side effects also. She does not want to try Lyrica or alternative agent at this time.  She underwent EMG nerve conduction study done 04/06/2019 by Dr. Jannifer Franklin which confirm sensory peripheral polyneuropathy.  She is has been bothered by back problems with a pinched nerve and she does see Dr. Saintclair Halsted who operated on her neck in the past.  She also is planning on having MRI of her back done.  Update  03/31/2019 Dr. Leonie Man:  Patient returns for follow-up after last visit 6 months ago.  She is accompanied by her son.  She states she was not able to tolerate Topamax which is started at last visit because of dizziness.  She was only on a small dose of 50 mg twice daily and discontinued it.  She remains on gabapentin and the current dose of 300 mg 3 times daily which is tolerating well and states she is never been on a higher dose but still complains of significant paresthesias and numbness in her feet.  This is constant and bothersome.  She had neuropathy panel labs done on 09/15/2018 all of which were normal.  I had requested the EMG nerve conduction study but for unclear reason that has not yet been scheduled.  She did have surgery for left knee replacement on 10/13/2018 which went well.  She has no new complaints today.  I am yet to receive records from Dr. Kary Kos and Dr. Applegate's office   Initial visit 09/15/18 Dr. Leonie Man : [name] is a 88 year old pleasant Caucasian lady seen today for initial office consultation visit for neuropathy.  History is obtained from the patient as well as referral notes and from her son was present for this visit.  She has a past medical history of hypertension, degenerative disc disease, hypothyroidism basal cell carcinoma of the nose.  States she has a 10-year history of sensory peripheral neuropathy.  She describes this as discomfort in her feet from the ankle down bilaterally.  Over the last 6 years she was being followed at Dr. Legrand Como Applegate's neurology clinic in [place] but for the last couple of years since he left this practice she has had no neurological follow-up.  She apparently had a EMG nerve conduction study done in September 2015 which showed absent sural sensory responses but I do not have that report to review.  She states in the last 3 months or so she is noticed increased and worsening particularly in her right leg.  She describes discomfort now present  throughout the day but more noticeable when she is is on her feet or at night.  At times she has trouble sleeping.  She is currently taking Neurontin 300 mg 3 times daily which previously used to work well but there is no longer keeping her discomfort under control.  She also admits to balance being slightly off however she has had no major falls.  She can get by indoors without any help but does use a cane for outdoors.  She was recently seen by Dr. Dominica Severin cram neurosurgeon who apparently did some work-up for degenerative back disease but I do not have his records for my review today.  Similarly I have not seen Dr. Applegate's records for my review today as well.  Patient also has some numbness in her hands but they are it is not  as bad.  She denies significant weakness in the hands or diminished fine motor skills.  She does not trip or fall.  She has not tried other medications besides gabapentin for neuropathic pain.  She denies any recent loss of weight, decreased appetite, fever or chronic cough.  She denies history of diabetes or heavy alcohol intake    ROS:   14 system review of systems is positive for those listed in HPI and all other systems negative  PMH:  Past Medical History:  Diagnosis Date   Arthritis    Cancer ([place])    Skin-Squamous cell   Complication of anesthesia    Hypertension    Hyperthyroidism    Hypothyroidism    Iron deficiency anemia    Neuropathy    PONV (postoperative nausea and vomiting)    Vitamin B 12 deficiency     Social History:  Social History   Socioeconomic History   Marital status: Widowed    Spouse name: Not on file   Number of children: Not on file   Years of education: Not on file   Highest education level: Not on file  Occupational History   Not on file  Tobacco Use   Smoking status: Never   Smokeless tobacco: Never  Vaping Use   Vaping Use: Never used  Substance and Sexual Activity   Alcohol use: Never   Drug use: Never   Sexual  activity: Not on file  Other Topics Concern   Not on file  Social History Narrative   Not on file   Social Determinants of Health   Financial Resource Strain: Not on file  Food Insecurity: Not on file  Transportation Needs: Not on file  Physical Activity: Not on file  Stress: Not on file  Social Connections: Not on file  Intimate Partner Violence: Not on file    Medications:   Current Outpatient Medications on File Prior to Visit  Medication Sig Dispense Refill   aspirin 325 MG EC tablet Take 1 tablet (325 mg total) by mouth 2 (two) times daily. 30 tablet 0   Biotin 5000 MCG TABS Take 5,000 mcg by mouth daily.      Calcium Carb-Cholecalciferol (CALCIUM 600+D3 PO) Take 1 tablet by mouth daily.     gabapentin (NEURONTIN) 300 MG capsule TAKE 1 CAPSULE BY MOUTH IN THE MORNING, 1 CAPSULE IN THE AFTERNOON, AND 2 CAPSULES IN THE EVENING. 360 capsule 3   levothyroxine (SYNTHROID, LEVOTHROID) 88 MCG tablet Take 88 mcg by mouth daily before breakfast.     MYCOPHENOLATE MOFETIL PO Take 500 mg by mouth 3 (three) times daily.     Omega-3 Fatty Acids (FISH OIL) 1000 MG CAPS Take 1,000 mg by mouth daily.     triamterene-hydrochlorothiazide (MAXZIDE-25) 37.5-25 MG tablet Take 1 tablet by mouth daily.      vitamin B-12 (CYANOCOBALAMIN) 1000 MCG tablet Take 1,000 mcg by mouth daily.     No current facility-administered medications on file prior to visit.    Allergies:   Allergies  Allergen Reactions   Prednisone Hives   Sulfa Antibiotics Anaphylaxis and Rash    Throat swelling/fever    Physical Exam Today's Vitals   01/01/22 1418  BP: (!) 160/70  Pulse: 73  Weight: 148 lb 12.8 oz (67.5 kg)  Height: 5' 4.6" (1.641 m)    Body mass index is 25.07 kg/m.  General: frail  pleasant elderly Caucasian lady, seated, in no evident distress Head: head normocephalic and atraumatic.   Neck: supple  with no carotid or supraclavicular bruits.  Surgical scar from prior thyroid surgery in the  neck Cardiovascular: regular rate and rhythm, no murmurs Musculoskeletal: Bilateral hand arthritic nodules Skin:  no rash/petichiae Vascular:  Normal pulses all extremities  Neurologic Exam Mental Status: Awake and fully alert.  Fluent speech and language.  Oriented to place and time. Recent and remote memory intact. Attention span, concentration and fund of knowledge appropriate. Mood and affect appropriate.  Cranial Nerves: pupils equal, briskly reactive to light. Extraocular movements full without nystagmus. Visual fields full to confrontation. Hearing intact. Facial sensation intact. Face, tongue, palate moves normally and symmetrically.  Motor: Normal bulk and tone. Normal strength in all tested extremity muscles. Sensory.: diminished vibratory and light touch sensation bilaterally from knee down bilaterally Coordination: Rapid alternating movements normal in all extremities. Finger-to-nose and heel-to-shin performed accurately bilaterally. Gait and Station: Arises from chair without difficulty. Stance is slightly hunched. Gait demonstrates decreased stride length and step height with mild unsteadiness without use of assistive device.  She is unable to heel, toe and tandem walk  .  Reflexes: 1+ and symmetric except both ankle jerks are absent.. Toes downgoing.     ASSESSMENT/PLAN: 41 lady with chronic sensory peripheral neuropathy with recent flareup and her lower extremity paresthesias of unclear etiology.  Also chronic c/o R>L LE nocturnal cramping   I had a long discussion with the patient and her son about her neuropathy and leg cramps which appear stable.  I recommend he continue gabapentin 300 mg, 300 mg in the afternoon and 600 mg at bedtime.  He has had trouble tolerating higher doses in the past.  I encouraged her to drink plenty of fluids eat potassium containing foods.  I also encouraged her to discuss with primary care physician on switching her Maxide to alternative blood  pressure occasions as this may be contributing to cramps.  She was encouraged to use a cane while walking outdoors and long distances discussed fall prevention precautions.  Return for follow-up in the future in a year and a nurse practitioner or call earlier if necessary. I spent 4mnutes of face-to-face and non-face-to-face time with patient and son  This included previsit chart review, lab review, study review, order entry, electronic health record documentation, patient and husband education and discussion regarding sensory neuropathy with ongoing use of gabapentin, nocturnal cramping and answered all her questions to patient and husband's satisfaction  PAntony Contras MD  [facility] [address] [place] [address] Phone [phone] [phone]: This document was prepared with digital dictation and possible smart phrase technology. Any transcriptional errors that result from this process are unintentional.

## 2022-01-01 NOTE — Patient Instructions (Signed)
I had a long discussion with the patient and her son about her neuropathy and leg cramps which appear stable.  I recommend he continue gabapentin 300 mg, 300 mg in the afternoon and 600 mg at bedtime.  He has had trouble tolerating higher doses in the past.  I encouraged her to drink plenty of fluids eat potassium containing foods.  I also encouraged her to discuss with primary care physician on switching her Maxide to alternative blood pressure occasions as this may be contributing to cramps.  She was encouraged to use a cane while walking outdoors and long distances discussed fall prevention precautions.  Return for follow-up in the future in a year and a nurse practitioner or call earlier if necessary.  Fall Prevention in the Home, Adult Falls can cause injuries and can happen to people of all ages. There are many things you can do to make your home safe and to help prevent falls. Ask for help when making these changes. What actions can I take to prevent falls? General Instructions Use good lighting in all rooms. Replace any light bulbs that burn out. Turn on the lights in dark areas. Use night-lights. Keep items that you use often in easy-to-reach places. Lower the shelves around your home if needed. Set up your furniture so you have a clear path. Avoid moving your furniture around. Do not have throw rugs or other things on the floor that can make you trip. Avoid walking on wet floors. If any of your floors are uneven, fix them. Add color or contrast paint or tape to clearly mark and help you see: Grab bars or handrails. First and last steps of staircases. Where the edge of each step is. If you use a stepladder: Make sure that it is fully opened. Do not climb a closed stepladder. Make sure the sides of the stepladder are locked in place. Ask someone to hold the stepladder while you use it. Know where your pets are when moving through your home. What can I do in the bathroom?     Keep  the floor dry. Clean up any water on the floor right away. Remove soap buildup in the tub or shower. Use nonskid mats or decals on the floor of the tub or shower. Attach bath mats securely with double-sided, nonslip rug tape. If you need to sit down in the shower, use a plastic, nonslip stool. Install grab bars by the toilet and in the tub and shower. Do not use towel bars as grab bars. What can I do in the bedroom? Make sure that you have a light by your bed that is easy to reach. Do not use any sheets or blankets for your bed that hang to the floor. Have a firm chair with side arms that you can use for support when you get dressed. What can I do in the kitchen? Clean up any spills right away. If you need to reach something above you, use a step stool with a grab bar. Keep electrical cords out of the way. Do not use floor polish or wax that makes floors slippery. What can I do with my stairs? Do not leave any items on the stairs. Make sure that you have a light switch at the top and the bottom of the stairs. Make sure that there are handrails on both sides of the stairs. Fix handrails that are broken or loose. Install nonslip stair treads on all your stairs. Avoid having throw rugs at the top or  bottom of the stairs. Choose a carpet that does not hide the edge of the steps on the stairs. Check carpeting to make sure that it is firmly attached to the stairs. Fix carpet that is loose or worn. What can I do on the outside of my home? Use bright outdoor lighting. Fix the edges of walkways and driveways and fix any cracks. Remove anything that might make you trip as you walk through a door, such as a raised step or threshold. Trim any bushes or trees on paths to your home. Check to see if handrails are loose or broken and that both sides of all steps have handrails. Install guardrails along the edges of any raised decks and porches. Clear paths of anything that can make you trip, such as  tools or rocks. Have leaves, snow, or ice cleared regularly. Use sand or salt on paths during winter. Clean up any spills in your garage right away. This includes grease or oil spills. What other actions can I take? Wear shoes that: Have a low heel. Do not wear high heels. Have rubber bottoms. Feel good on your feet and fit well. Are closed at the toe. Do not wear open-toe sandals. Use tools that help you move around if needed. These include: Canes. Walkers. Scooters. Crutches. Review your medicines with your doctor. Some medicines can make you feel dizzy. This can increase your chance of falling. Ask your doctor what else you can do to help prevent falls. Where to find more information Centers for Disease Control and Prevention, [employer]: http://www.wolf.info/ National Institute on Aging: http://kim-miller.com/ Contact a doctor if: You are afraid of falling at home. You feel weak, drowsy, or dizzy at home. You fall at home. Summary There are many simple things that you can do to make your home safe and to help prevent falls. Ways to make your home safe include removing things that can make you trip and installing grab bars in the bathroom. Ask for help when making these changes in your home. This information is not intended to replace advice given to you by your health care provider. Make sure you discuss any questions you have with your health care provider. Document Revised: 12/05/2020 Document Reviewed: 10/07/2019 Elsevier Patient Education  [place].

## 2022-01-16 DIAGNOSIS — E039 Hypothyroidism, unspecified: Secondary | ICD-10-CM | POA: Diagnosis not present

## 2022-01-16 DIAGNOSIS — I10 Essential (primary) hypertension: Secondary | ICD-10-CM | POA: Diagnosis not present

## 2022-01-19 DIAGNOSIS — J189 Pneumonia, unspecified organism: Secondary | ICD-10-CM | POA: Diagnosis not present

## 2022-01-19 DIAGNOSIS — J0100 Acute maxillary sinusitis, unspecified: Secondary | ICD-10-CM | POA: Diagnosis not present

## 2022-01-24 DIAGNOSIS — M9904 Segmental and somatic dysfunction of sacral region: Secondary | ICD-10-CM | POA: Diagnosis not present

## 2022-01-24 DIAGNOSIS — M9903 Segmental and somatic dysfunction of lumbar region: Secondary | ICD-10-CM | POA: Diagnosis not present

## 2022-01-24 DIAGNOSIS — M5442 Lumbago with sciatica, left side: Secondary | ICD-10-CM | POA: Diagnosis not present

## 2022-01-24 DIAGNOSIS — M5137 Other intervertebral disc degeneration, lumbosacral region: Secondary | ICD-10-CM | POA: Diagnosis not present

## 2022-01-24 DIAGNOSIS — M5136 Other intervertebral disc degeneration, lumbar region: Secondary | ICD-10-CM | POA: Diagnosis not present

## 2022-02-02 DIAGNOSIS — J189 Pneumonia, unspecified organism: Secondary | ICD-10-CM | POA: Diagnosis not present

## 2022-02-02 DIAGNOSIS — R531 Weakness: Secondary | ICD-10-CM | POA: Diagnosis not present

## 2022-02-06 DIAGNOSIS — R5383 Other fatigue: Secondary | ICD-10-CM | POA: Diagnosis not present

## 2022-02-06 DIAGNOSIS — Z6824 Body mass index (BMI) 24.0-24.9, adult: Secondary | ICD-10-CM | POA: Diagnosis not present

## 2022-02-06 DIAGNOSIS — R0602 Shortness of breath: Secondary | ICD-10-CM | POA: Diagnosis not present

## 2022-02-06 DIAGNOSIS — K449 Diaphragmatic hernia without obstruction or gangrene: Secondary | ICD-10-CM | POA: Diagnosis not present

## 2022-02-15 DIAGNOSIS — G629 Polyneuropathy, unspecified: Secondary | ICD-10-CM | POA: Diagnosis not present

## 2022-02-15 DIAGNOSIS — E039 Hypothyroidism, unspecified: Secondary | ICD-10-CM | POA: Diagnosis not present

## 2022-02-15 DIAGNOSIS — M4726 Other spondylosis with radiculopathy, lumbar region: Secondary | ICD-10-CM | POA: Diagnosis not present

## 2022-02-15 DIAGNOSIS — Z6824 Body mass index (BMI) 24.0-24.9, adult: Secondary | ICD-10-CM | POA: Diagnosis not present

## 2022-02-15 DIAGNOSIS — I10 Essential (primary) hypertension: Secondary | ICD-10-CM | POA: Diagnosis not present

## 2022-02-15 DIAGNOSIS — F419 Anxiety disorder, unspecified: Secondary | ICD-10-CM | POA: Diagnosis not present

## 2022-02-24 DIAGNOSIS — D0471 Carcinoma in situ of skin of right lower limb, including hip: Secondary | ICD-10-CM | POA: Diagnosis not present

## 2022-02-24 DIAGNOSIS — L209 Atopic dermatitis, unspecified: Secondary | ICD-10-CM | POA: Diagnosis not present

## 2022-02-24 DIAGNOSIS — L299 Pruritus, unspecified: Secondary | ICD-10-CM | POA: Diagnosis not present

## 2022-02-27 ENCOUNTER — Other Ambulatory Visit: Payer: Self-pay

## 2022-02-27 ENCOUNTER — Other Ambulatory Visit: Payer: Self-pay | Admitting: Adult Health

## 2022-02-28 DIAGNOSIS — M9904 Segmental and somatic dysfunction of sacral region: Secondary | ICD-10-CM | POA: Diagnosis not present

## 2022-02-28 DIAGNOSIS — M5136 Other intervertebral disc degeneration, lumbar region: Secondary | ICD-10-CM | POA: Diagnosis not present

## 2022-02-28 DIAGNOSIS — M9903 Segmental and somatic dysfunction of lumbar region: Secondary | ICD-10-CM | POA: Diagnosis not present

## 2022-02-28 DIAGNOSIS — M5442 Lumbago with sciatica, left side: Secondary | ICD-10-CM | POA: Diagnosis not present

## 2022-02-28 DIAGNOSIS — M5137 Other intervertebral disc degeneration, lumbosacral region: Secondary | ICD-10-CM | POA: Diagnosis not present

## 2022-03-29 DIAGNOSIS — M5137 Other intervertebral disc degeneration, lumbosacral region: Secondary | ICD-10-CM | POA: Diagnosis not present

## 2022-03-29 DIAGNOSIS — M9904 Segmental and somatic dysfunction of sacral region: Secondary | ICD-10-CM | POA: Diagnosis not present

## 2022-03-29 DIAGNOSIS — M5136 Other intervertebral disc degeneration, lumbar region: Secondary | ICD-10-CM | POA: Diagnosis not present

## 2022-03-29 DIAGNOSIS — M9903 Segmental and somatic dysfunction of lumbar region: Secondary | ICD-10-CM | POA: Diagnosis not present

## 2022-03-29 DIAGNOSIS — M5442 Lumbago with sciatica, left side: Secondary | ICD-10-CM | POA: Diagnosis not present

## 2022-04-06 DIAGNOSIS — J029 Acute pharyngitis, unspecified: Secondary | ICD-10-CM | POA: Diagnosis not present

## 2022-04-12 DIAGNOSIS — H353112 Nonexudative age-related macular degeneration, right eye, intermediate dry stage: Secondary | ICD-10-CM | POA: Diagnosis not present

## 2022-04-12 DIAGNOSIS — H353123 Nonexudative age-related macular degeneration, left eye, advanced atrophic without subfoveal involvement: Secondary | ICD-10-CM | POA: Diagnosis not present

## 2022-04-12 DIAGNOSIS — H18523 Epithelial (juvenile) corneal dystrophy, bilateral: Secondary | ICD-10-CM | POA: Diagnosis not present

## 2022-04-25 DIAGNOSIS — M5442 Lumbago with sciatica, left side: Secondary | ICD-10-CM | POA: Diagnosis not present

## 2022-04-25 DIAGNOSIS — M5136 Other intervertebral disc degeneration, lumbar region: Secondary | ICD-10-CM | POA: Diagnosis not present

## 2022-04-25 DIAGNOSIS — M9903 Segmental and somatic dysfunction of lumbar region: Secondary | ICD-10-CM | POA: Diagnosis not present

## 2022-04-25 DIAGNOSIS — M9904 Segmental and somatic dysfunction of sacral region: Secondary | ICD-10-CM | POA: Diagnosis not present

## 2022-04-25 DIAGNOSIS — M5137 Other intervertebral disc degeneration, lumbosacral region: Secondary | ICD-10-CM | POA: Diagnosis not present

## 2022-04-26 DIAGNOSIS — H353123 Nonexudative age-related macular degeneration, left eye, advanced atrophic without subfoveal involvement: Secondary | ICD-10-CM | POA: Diagnosis not present

## 2022-05-22 DIAGNOSIS — M9904 Segmental and somatic dysfunction of sacral region: Secondary | ICD-10-CM | POA: Diagnosis not present

## 2022-05-22 DIAGNOSIS — M5137 Other intervertebral disc degeneration, lumbosacral region: Secondary | ICD-10-CM | POA: Diagnosis not present

## 2022-05-22 DIAGNOSIS — M9903 Segmental and somatic dysfunction of lumbar region: Secondary | ICD-10-CM | POA: Diagnosis not present

## 2022-05-22 DIAGNOSIS — M5442 Lumbago with sciatica, left side: Secondary | ICD-10-CM | POA: Diagnosis not present

## 2022-05-22 DIAGNOSIS — M5136 Other intervertebral disc degeneration, lumbar region: Secondary | ICD-10-CM | POA: Diagnosis not present

## 2022-06-06 ENCOUNTER — Encounter: Payer: Self-pay | Admitting: Neurology

## 2022-06-19 DIAGNOSIS — M5137 Other intervertebral disc degeneration, lumbosacral region: Secondary | ICD-10-CM | POA: Diagnosis not present

## 2022-06-19 DIAGNOSIS — M5136 Other intervertebral disc degeneration, lumbar region: Secondary | ICD-10-CM | POA: Diagnosis not present

## 2022-06-19 DIAGNOSIS — M9904 Segmental and somatic dysfunction of sacral region: Secondary | ICD-10-CM | POA: Diagnosis not present

## 2022-06-19 DIAGNOSIS — M9903 Segmental and somatic dysfunction of lumbar region: Secondary | ICD-10-CM | POA: Diagnosis not present

## 2022-06-19 DIAGNOSIS — M5442 Lumbago with sciatica, left side: Secondary | ICD-10-CM | POA: Diagnosis not present

## 2022-06-21 DIAGNOSIS — H353123 Nonexudative age-related macular degeneration, left eye, advanced atrophic without subfoveal involvement: Secondary | ICD-10-CM | POA: Diagnosis not present

## 2022-06-23 DIAGNOSIS — L299 Pruritus, unspecified: Secondary | ICD-10-CM | POA: Diagnosis not present

## 2022-06-23 DIAGNOSIS — L209 Atopic dermatitis, unspecified: Secondary | ICD-10-CM | POA: Diagnosis not present

## 2022-07-18 DIAGNOSIS — M9904 Segmental and somatic dysfunction of sacral region: Secondary | ICD-10-CM | POA: Diagnosis not present

## 2022-07-18 DIAGNOSIS — M5137 Other intervertebral disc degeneration, lumbosacral region: Secondary | ICD-10-CM | POA: Diagnosis not present

## 2022-07-18 DIAGNOSIS — M9903 Segmental and somatic dysfunction of lumbar region: Secondary | ICD-10-CM | POA: Diagnosis not present

## 2022-07-18 DIAGNOSIS — M5136 Other intervertebral disc degeneration, lumbar region: Secondary | ICD-10-CM | POA: Diagnosis not present

## 2022-07-18 DIAGNOSIS — M5442 Lumbago with sciatica, left side: Secondary | ICD-10-CM | POA: Diagnosis not present

## 2022-08-15 DIAGNOSIS — M5136 Other intervertebral disc degeneration, lumbar region: Secondary | ICD-10-CM | POA: Diagnosis not present

## 2022-08-15 DIAGNOSIS — M5137 Other intervertebral disc degeneration, lumbosacral region: Secondary | ICD-10-CM | POA: Diagnosis not present

## 2022-08-15 DIAGNOSIS — M9904 Segmental and somatic dysfunction of sacral region: Secondary | ICD-10-CM | POA: Diagnosis not present

## 2022-08-15 DIAGNOSIS — M5442 Lumbago with sciatica, left side: Secondary | ICD-10-CM | POA: Diagnosis not present

## 2022-08-15 DIAGNOSIS — M9903 Segmental and somatic dysfunction of lumbar region: Secondary | ICD-10-CM | POA: Diagnosis not present

## 2022-08-16 DIAGNOSIS — H353123 Nonexudative age-related macular degeneration, left eye, advanced atrophic without subfoveal involvement: Secondary | ICD-10-CM | POA: Diagnosis not present

## 2022-09-12 DIAGNOSIS — M9904 Segmental and somatic dysfunction of sacral region: Secondary | ICD-10-CM | POA: Diagnosis not present

## 2022-09-12 DIAGNOSIS — M5442 Lumbago with sciatica, left side: Secondary | ICD-10-CM | POA: Diagnosis not present

## 2022-09-12 DIAGNOSIS — M5136 Other intervertebral disc degeneration, lumbar region: Secondary | ICD-10-CM | POA: Diagnosis not present

## 2022-09-12 DIAGNOSIS — M9903 Segmental and somatic dysfunction of lumbar region: Secondary | ICD-10-CM | POA: Diagnosis not present

## 2022-09-12 DIAGNOSIS — M5137 Other intervertebral disc degeneration, lumbosacral region: Secondary | ICD-10-CM | POA: Diagnosis not present

## 2022-10-01 DIAGNOSIS — Z1331 Encounter for screening for depression: Secondary | ICD-10-CM | POA: Diagnosis not present

## 2022-10-01 DIAGNOSIS — Z6825 Body mass index (BMI) 25.0-25.9, adult: Secondary | ICD-10-CM | POA: Diagnosis not present

## 2022-10-01 DIAGNOSIS — Z79899 Other long term (current) drug therapy: Secondary | ICD-10-CM | POA: Diagnosis not present

## 2022-10-01 DIAGNOSIS — H353230 Exudative age-related macular degeneration, bilateral, stage unspecified: Secondary | ICD-10-CM | POA: Diagnosis not present

## 2022-10-01 DIAGNOSIS — G629 Polyneuropathy, unspecified: Secondary | ICD-10-CM | POA: Diagnosis not present

## 2022-10-01 DIAGNOSIS — I10 Essential (primary) hypertension: Secondary | ICD-10-CM | POA: Diagnosis not present

## 2022-10-01 DIAGNOSIS — Z0000 Encounter for general adult medical examination without abnormal findings: Secondary | ICD-10-CM | POA: Diagnosis not present

## 2022-10-01 DIAGNOSIS — E039 Hypothyroidism, unspecified: Secondary | ICD-10-CM | POA: Diagnosis not present

## 2022-10-04 DIAGNOSIS — Z1231 Encounter for screening mammogram for malignant neoplasm of breast: Secondary | ICD-10-CM | POA: Diagnosis not present

## 2022-10-10 DIAGNOSIS — M5442 Lumbago with sciatica, left side: Secondary | ICD-10-CM | POA: Diagnosis not present

## 2022-10-10 DIAGNOSIS — M5136 Other intervertebral disc degeneration, lumbar region: Secondary | ICD-10-CM | POA: Diagnosis not present

## 2022-10-10 DIAGNOSIS — M9904 Segmental and somatic dysfunction of sacral region: Secondary | ICD-10-CM | POA: Diagnosis not present

## 2022-10-10 DIAGNOSIS — M5137 Other intervertebral disc degeneration, lumbosacral region: Secondary | ICD-10-CM | POA: Diagnosis not present

## 2022-10-10 DIAGNOSIS — M9903 Segmental and somatic dysfunction of lumbar region: Secondary | ICD-10-CM | POA: Diagnosis not present

## 2022-10-11 DIAGNOSIS — H353123 Nonexudative age-related macular degeneration, left eye, advanced atrophic without subfoveal involvement: Secondary | ICD-10-CM | POA: Diagnosis not present

## 2022-10-13 DIAGNOSIS — L209 Atopic dermatitis, unspecified: Secondary | ICD-10-CM | POA: Diagnosis not present

## 2022-10-13 DIAGNOSIS — L290 Pruritus ani: Secondary | ICD-10-CM | POA: Diagnosis not present

## 2022-10-13 DIAGNOSIS — L570 Actinic keratosis: Secondary | ICD-10-CM | POA: Diagnosis not present

## 2022-11-07 DIAGNOSIS — M5442 Lumbago with sciatica, left side: Secondary | ICD-10-CM | POA: Diagnosis not present

## 2022-11-07 DIAGNOSIS — M5136 Other intervertebral disc degeneration, lumbar region: Secondary | ICD-10-CM | POA: Diagnosis not present

## 2022-11-07 DIAGNOSIS — M9904 Segmental and somatic dysfunction of sacral region: Secondary | ICD-10-CM | POA: Diagnosis not present

## 2022-11-07 DIAGNOSIS — M9903 Segmental and somatic dysfunction of lumbar region: Secondary | ICD-10-CM | POA: Diagnosis not present

## 2022-11-07 DIAGNOSIS — M5137 Other intervertebral disc degeneration, lumbosacral region: Secondary | ICD-10-CM | POA: Diagnosis not present

## 2022-11-13 DIAGNOSIS — D649 Anemia, unspecified: Secondary | ICD-10-CM | POA: Diagnosis not present

## 2022-11-13 DIAGNOSIS — Z79899 Other long term (current) drug therapy: Secondary | ICD-10-CM | POA: Diagnosis not present

## 2022-12-05 DIAGNOSIS — M5137 Other intervertebral disc degeneration, lumbosacral region: Secondary | ICD-10-CM | POA: Diagnosis not present

## 2022-12-05 DIAGNOSIS — M9904 Segmental and somatic dysfunction of sacral region: Secondary | ICD-10-CM | POA: Diagnosis not present

## 2022-12-05 DIAGNOSIS — M5442 Lumbago with sciatica, left side: Secondary | ICD-10-CM | POA: Diagnosis not present

## 2022-12-05 DIAGNOSIS — M5136 Other intervertebral disc degeneration, lumbar region: Secondary | ICD-10-CM | POA: Diagnosis not present

## 2022-12-05 DIAGNOSIS — M9903 Segmental and somatic dysfunction of lumbar region: Secondary | ICD-10-CM | POA: Diagnosis not present

## 2022-12-06 DIAGNOSIS — H353123 Nonexudative age-related macular degeneration, left eye, advanced atrophic without subfoveal involvement: Secondary | ICD-10-CM | POA: Diagnosis not present

## 2022-12-11 DIAGNOSIS — H35051 Retinal neovascularization, unspecified, right eye: Secondary | ICD-10-CM | POA: Diagnosis not present

## 2022-12-27 DIAGNOSIS — H35051 Retinal neovascularization, unspecified, right eye: Secondary | ICD-10-CM | POA: Diagnosis not present

## 2023-01-01 ENCOUNTER — Ambulatory Visit: Payer: PPO | Admitting: Neurology

## 2023-01-02 DIAGNOSIS — M51372 Other intervertebral disc degeneration, lumbosacral region with discogenic back pain and lower extremity pain: Secondary | ICD-10-CM | POA: Diagnosis not present

## 2023-01-02 DIAGNOSIS — M9904 Segmental and somatic dysfunction of sacral region: Secondary | ICD-10-CM | POA: Diagnosis not present

## 2023-01-02 DIAGNOSIS — M5442 Lumbago with sciatica, left side: Secondary | ICD-10-CM | POA: Diagnosis not present

## 2023-01-02 DIAGNOSIS — M9903 Segmental and somatic dysfunction of lumbar region: Secondary | ICD-10-CM | POA: Diagnosis not present

## 2023-01-02 DIAGNOSIS — M51362 Other intervertebral disc degeneration, lumbar region with discogenic back pain and lower extremity pain: Secondary | ICD-10-CM | POA: Diagnosis not present

## 2023-01-14 ENCOUNTER — Ambulatory Visit: Payer: PPO | Admitting: Neurology

## 2023-01-14 ENCOUNTER — Encounter: Payer: Self-pay | Admitting: Neurology

## 2023-01-14 VITALS — BP 164/73 | HR 69 | Ht 64.0 in | Wt 147.0 lb

## 2023-01-14 DIAGNOSIS — R202 Paresthesia of skin: Secondary | ICD-10-CM

## 2023-01-14 DIAGNOSIS — R252 Cramp and spasm: Secondary | ICD-10-CM

## 2023-01-14 DIAGNOSIS — G629 Polyneuropathy, unspecified: Secondary | ICD-10-CM | POA: Diagnosis not present

## 2023-01-14 MED ORDER — TOPIRAMATE 25 MG PO TABS: 25.0000 mg | ORAL_TABLET | Freq: Two times a day (BID) | ORAL | 3 refills | Status: DC

## 2023-01-14 NOTE — Patient Instructions (Signed)
I had a long discussion with the patient and her son about her chronic neuropathy and extremity cramps which appear stable but yet bothersome.  I recommend she continue gabapentin 300 mg, 300 mg in the afternoon and 600 mg at bedtime.  He has had trouble tolerating higher doses in the past.  Trial of Topamax 25 mg twice daily for help with cramps and paresthesias.I encouraged her to drink plenty of fluids eat potassium containing foods.  I also encouraged her to discuss with primary care physician on switching her Maxide to alternative blood pressure occasions as this may be contributing to cramps.  She was encouraged to use a cane while walking outdoors and long distances discussed fall prevention precautions.  Return for follow-up in the future in a year with my nurse    practitioner or call earlier if necessary.

## 2023-01-14 NOTE — Progress Notes (Signed)
[facility] [address] [place]. [place] [address] [phone]       OFFICE FOLLOW UP NOTE  Patricia Mason Date of Birth:  05/13/1930 Medical Record Number:  [id number]   Referring MD:  Rolm Gala, NP  Reason for Referral:  Sensory neuropathy   Chief Complaint  Patient presents with   Follow-up    Patient in room #17 with her son.  Patient states she still having cramps and numbness in both hands. Patient states sometime at night she has cramps and numbness in both legs.      HPI:  Update 01/14/2023 : She returns for follow-up after last visit a year ago.  She is accompanied by her son.  She continues to have cramps in her hands and feet particularly after she is use them.  She continues to take gabapentin 4 tablets a day and has not been able to tolerate a higher dose even though she feels it is helping.  She has not tried any other medications.  She continues to have numbness in her hands and feet and some difficulty with fine motor skills.  She continues to otherwise do well and is independent actives of daily living.  She is careful with her walking and and has not had any falls or injuries.  She walks about 30 minutes a day.  She does have chronic low low back pain which is bothering her recently.  She has no new complaints. Update 01/01/2022 : She returns for follow-up after last visit in December 2022.  She is accompanied by her son today.  Patient continues to have cramps in her hands and feet.  These occur mostly at night 2 weeks or so.  They involve the lateral aspect of her legs and she is able to walk without.  Remains on gabapentin 300 mg morning, 300 and at night.  He has not been able to tolerate a higher dose in the past.  He also complains of a pulling sensation in the hand and wonders if this is related to her arthritis.  She ambulates with a cane states she is doing well.  She exercises at least 30 minutes by walking every day.  She has had no  recent falls or injuries.  She states her blood pressure is under good control though today it is elevated 160/70.  She is tolerating well without bruising or bleeding.  Update 02/23/2021 JM: Returns for follow-up after prior visit over 1 year ago accompanied by her husband, Patricia Mason  C/o nocturnal leg cramps R>L usually on typically located anterior and lateral muscles distally. Occurs 1-2x per week but will awaken her from her sleep after changing position.  After ambulating, seems to subside but will restart after laying back down. These are painful - more than just a "discomfort".  C/o left hand cramping after certain movement such as styling her hair. Typically will not last long  Mild progression of neuropathy with slight increase of numbness in feet bilaterally but continues to deny any nerve pain sensation.  She has remained on gabapentin 300/300/600 -denies side effects.  No further concerns at this time    History provided for reference purposes only Update 12/24/2019 JM: Patricia Mason returns for 72-month follow-up accompanied by her son for sensory neuropathy.  Neuropathy has been stable since prior visit with ongoing use of gabapentin 300/300/600 tolerating dosage well without side effects.  She continues to have numbness in her feet and right leg cramping (present since knee surgery)  but denies any painful symptoms such as burning, pinprick or electrical shock sensations.  Continues to use a cane for ambulation and denies any recent falls.  She does report right pointer finger catching sensation with at times wanting to hyperextend.  Reports occasional cramping sensation but otherwise denies pain.  She is unsure if related to arthritis as she has extensive bilateral hand arthritis and inability to fully flex right pointer finger which is chronic.  She continues to follow with Dr. Wynetta Emery for chronic lumbar disease.  No further concerns.  Update 06/22/2019 Dr. Pearlean Brownie : She returns for follow-up after last  visit with me 12 months ago.  She is accompanied by her son.  Patient states she did not tolerate increasing the gabapentin to 600 times daily as it made her have some palpitations as well as she developed constipation.  She reduce the dose back to 4 tablets a day and is doing better.  She still has numbness in the feet particularly when she is on her feet and has been walking for some time.  She denies burning or severe pain.  She states she is gotten used to this.  She was previously not able to tolerate Topamax due to side effects also. She does not want to try Lyrica or alternative agent at this time.  She underwent EMG nerve conduction study done 04/06/2019 by Dr. Anne Hahn which confirm sensory peripheral polyneuropathy.  She is has been bothered by back problems with a pinched nerve and she does see Dr. Wynetta Emery who operated on her neck in the past.  She also is planning on having MRI of her back done.  Update 03/31/2019 Dr. Pearlean Brownie:  Patient returns for follow-up after last visit 6 months ago.  She is accompanied by her son.  She states she was not able to tolerate Topamax which is started at last visit because of dizziness.  She was only on a small dose of 50 mg twice daily and discontinued it.  She remains on gabapentin and the current dose of 300 mg 3 times daily which is tolerating well and states she is never been on a higher dose but still complains of significant paresthesias and numbness in her feet.  This is constant and bothersome.  She had neuropathy panel labs done on 09/15/2018 all of which were normal.  I had requested the EMG nerve conduction study but for unclear reason that has not yet been scheduled.  She did have surgery for left knee replacement on 10/13/2018 which went well.  She has no new complaints today.  I am yet to receive records from Dr. Donalee Citrin and Dr. Applegate's office   Initial visit 09/15/18 Dr. Pearlean Brownie : Patricia Mason is a 89 year old pleasant Caucasian lady seen today for initial office  consultation visit for neuropathy.  History is obtained from the patient as well as referral notes and from her son was present for this visit.  She has a past medical history of hypertension, degenerative disc disease, hypothyroidism basal cell carcinoma of the nose.  States she has a 10-year history of sensory peripheral neuropathy.  She describes this as discomfort in her feet from the ankle down bilaterally.  Over the last 6 years she was being followed at Dr. Casimiro Needle [facility]legate's neurology clinic in [place] but for the last couple of years since he left this practice she has had no neurological follow-up.  She apparently had a EMG nerve conduction study done in September 2015 which showed absent sural sensory responses  but I do not have that report to review.  She states in the last 3 months or so she is noticed increased and worsening particularly in her right leg.  She describes discomfort now present throughout the day but more noticeable when she is is on her feet or at night.  At times she has trouble sleeping.  She is currently taking Neurontin 300 mg 3 times daily which previously used to work well but there is no longer keeping her discomfort under control.  She also admits to balance being slightly off however she has had no major falls.  She can get by indoors without any help but does use a cane for outdoors.  She was recently seen by Dr. Jillyn Hidden cram neurosurgeon who apparently did some work-up for degenerative back disease but I do not have his records for my review today.  Similarly I have not seen Dr. Applegate's records for my review today as well.  Patient also has some numbness in her hands but they are it is not as bad.  She denies significant weakness in the hands or diminished fine motor skills.  She does not trip or fall.  She has not tried other medications besides gabapentin for neuropathic pain.  She denies any recent loss of weight, decreased appetite, fever or chronic cough.  She denies  history of diabetes or heavy alcohol intake    ROS:   14 system review of systems is positive for those listed in HPI and all other systems negative  PMH:  Past Medical History:  Diagnosis Date   Arthritis    Cancer (HCC)    Skin-Squamous cell   Complication of anesthesia    Hypertension    Hyperthyroidism    Hypothyroidism    Iron deficiency anemia    Neuropathy    PONV (postoperative nausea and vomiting)    Vitamin B 12 deficiency     Social History:  Social History   Socioeconomic History   Marital status: Widowed    Spouse name: Not on file   Number of children: Not on file   Years of education: Not on file   Highest education level: Not on file  Occupational History   Not on file  Tobacco Use   Smoking status: Never   Smokeless tobacco: Never  Vaping Use   Vaping status: Never Used  Substance and Sexual Activity   Alcohol use: Never   Drug use: Never   Sexual activity: Not on file  Other Topics Concern   Not on file  Social History Narrative   Not on file   Social Determinants of Health   Financial Resource Strain: Not on file  Food Insecurity: Not on file  Transportation Needs: Not on file  Physical Activity: Not on file  Stress: Not on file  Social Connections: Not on file  Intimate Partner Violence: Not on file    Medications:   Current Outpatient Medications on File Prior to Visit  Medication Sig Dispense Refill   aspirin 325 MG EC tablet Take 1 tablet (325 mg total) by mouth 2 (two) times daily. 30 tablet 0   Biotin 5000 MCG TABS Take 5,000 mcg by mouth daily.      Calcium Carb-Cholecalciferol (CALCIUM 600+D3 PO) Take 1 tablet by mouth daily.     gabapentin (NEURONTIN) 300 MG capsule TAKE 1 CAPSULE BY MOUTH IN THE MORNING, 1 CAPSULE IN THE AFTERNOON, AND 2 CAPSULES IN THE EVENING. 360 capsule 3   levothyroxine (SYNTHROID, LEVOTHROID) 88  MCG tablet Take 88 mcg by mouth daily before breakfast.     MYCOPHENOLATE MOFETIL PO Take 500 mg by mouth  3 (three) times daily.     Omega-3 Fatty Acids (FISH OIL) 1000 MG CAPS Take 1,000 mg by mouth daily.     triamterene-hydrochlorothiazide (MAXZIDE-25) 37.5-25 MG tablet Take 1 tablet by mouth daily.      vitamin B-12 (CYANOCOBALAMIN) 1000 MCG tablet Take 1,000 mcg by mouth daily.     No current facility-administered medications on file prior to visit.    Allergies:   Allergies  Allergen Reactions   Prednisone Hives   Sulfa Antibiotics Anaphylaxis and Rash    Throat swelling/fever    Physical Exam Today's Vitals   01/14/23 1408  BP: (!) 164/73  Pulse: 69  Weight: 147 lb (66.7 kg)  Height: 5\' 4"  (1.626 m)    Body mass index is 25.23 kg/m.  General: frail  pleasant elderly Caucasian lady, seated, in no evident distress Head: head normocephalic and atraumatic.   Neck: supple with no carotid or supraclavicular bruits.  Surgical scar from prior thyroid surgery in the neck Cardiovascular: regular rate and rhythm, no murmurs Musculoskeletal: Bilateral hand arthritic nodules Skin:  no rash/petichiae Vascular:  Normal pulses all extremities  Neurologic Exam Mental Status: Awake and fully alert.  Fluent speech and language.  Oriented to place and time. Recent and remote memory intact. Attention span, concentration and fund of knowledge appropriate. Mood and affect appropriate.  Cranial Nerves: pupils equal, briskly reactive to light. Extraocular movements full without nystagmus. Visual fields full to confrontation. Hearing intact. Facial sensation intact. Face, tongue, palate moves normally and symmetrically.  Motor: Normal bulk and tone. Normal strength in all tested extremity muscles. Sensory.: diminished vibratory and light touch sensation bilaterally from knee down bilaterally Coordination: Rapid alternating movements normal in all extremities. Finger-to-nose and heel-to-shin performed accurately bilaterally. Gait and Station: Arises from chair without difficulty. Stance is slightly  hunched. Gait demonstrates decreased stride length and step height with mild unsteadiness without use of assistive device.  She is unable to heel, toe and tandem walk  .  Reflexes: 1+ and symmetric except both ankle jerks are absent.. Toes downgoing.     ASSESSMENT/PLAN: 66 lady with chronic sensory peripheral neuropathy with pesrsistent  lower extremity paresthesias and cramps  of unclear etiology.  Also chronic c/o R>L LE nocturnal cramping   I had a long discussion with the patient and her son about her chronic neuropathy and extremity cramps which appear stable but yet bothersome.  I recommend she continue gabapentin 300 mg, 300 mg in the afternoon and 600 mg at bedtime.  He has had trouble tolerating higher doses in the past.  Trial of Topamax 25 mg twice daily for help with cramps and paresthesias.I encouraged her to drink plenty of fluids eat potassium containing foods.  I also encouraged her to discuss with primary care physician on switching her Maxide to alternative blood pressure occasions as this may be contributing to cramps.  She was encouraged to use a cane while walking outdoors and long distances discussed fall prevention precautions.  Return for follow-up in the future in a year with my nurse    practitioner or call earlier if necessary. I spent of face-to-face and non-face-to-face time with patient and son  This included previsit chart review, lab review, study review, order entry, electronic health record documentation, patient and husband education and discussion regarding sensory neuropathy with ongoing use of gabapentin, nocturnal cramping and  answered all her questions to patient and husband's satisfaction  Delia Heady, MD  [facility] [address] [place], [place] [address]  Phone [phone] Fax [phone] Note: This document was prepared with digital dictation and possible smart phrase technology. Any transcriptional errors  that result from this process are unintentional.

## 2023-01-30 DIAGNOSIS — M9903 Segmental and somatic dysfunction of lumbar region: Secondary | ICD-10-CM | POA: Diagnosis not present

## 2023-01-30 DIAGNOSIS — M51362 Other intervertebral disc degeneration, lumbar region with discogenic back pain and lower extremity pain: Secondary | ICD-10-CM | POA: Diagnosis not present

## 2023-01-30 DIAGNOSIS — M5442 Lumbago with sciatica, left side: Secondary | ICD-10-CM | POA: Diagnosis not present

## 2023-01-30 DIAGNOSIS — M9904 Segmental and somatic dysfunction of sacral region: Secondary | ICD-10-CM | POA: Diagnosis not present

## 2023-01-30 DIAGNOSIS — M51372 Other intervertebral disc degeneration, lumbosacral region with discogenic back pain and lower extremity pain: Secondary | ICD-10-CM | POA: Diagnosis not present

## 2023-01-31 DIAGNOSIS — H353112 Nonexudative age-related macular degeneration, right eye, intermediate dry stage: Secondary | ICD-10-CM | POA: Diagnosis not present

## 2023-01-31 DIAGNOSIS — H353123 Nonexudative age-related macular degeneration, left eye, advanced atrophic without subfoveal involvement: Secondary | ICD-10-CM | POA: Diagnosis not present

## 2023-02-02 DIAGNOSIS — L570 Actinic keratosis: Secondary | ICD-10-CM | POA: Diagnosis not present

## 2023-02-02 DIAGNOSIS — L209 Atopic dermatitis, unspecified: Secondary | ICD-10-CM | POA: Diagnosis not present

## 2023-02-02 DIAGNOSIS — L299 Pruritus, unspecified: Secondary | ICD-10-CM | POA: Diagnosis not present

## 2023-02-04 DIAGNOSIS — M5442 Lumbago with sciatica, left side: Secondary | ICD-10-CM | POA: Diagnosis not present

## 2023-02-04 DIAGNOSIS — M9903 Segmental and somatic dysfunction of lumbar region: Secondary | ICD-10-CM | POA: Diagnosis not present

## 2023-02-04 DIAGNOSIS — M9904 Segmental and somatic dysfunction of sacral region: Secondary | ICD-10-CM | POA: Diagnosis not present

## 2023-02-04 DIAGNOSIS — M51362 Other intervertebral disc degeneration, lumbar region with discogenic back pain and lower extremity pain: Secondary | ICD-10-CM | POA: Diagnosis not present

## 2023-02-05 DIAGNOSIS — M9903 Segmental and somatic dysfunction of lumbar region: Secondary | ICD-10-CM | POA: Diagnosis not present

## 2023-02-05 DIAGNOSIS — M51362 Other intervertebral disc degeneration, lumbar region with discogenic back pain and lower extremity pain: Secondary | ICD-10-CM | POA: Diagnosis not present

## 2023-02-05 DIAGNOSIS — M9904 Segmental and somatic dysfunction of sacral region: Secondary | ICD-10-CM | POA: Diagnosis not present

## 2023-02-05 DIAGNOSIS — M5442 Lumbago with sciatica, left side: Secondary | ICD-10-CM | POA: Diagnosis not present

## 2023-02-08 DIAGNOSIS — M51362 Other intervertebral disc degeneration, lumbar region with discogenic back pain and lower extremity pain: Secondary | ICD-10-CM | POA: Diagnosis not present

## 2023-02-08 DIAGNOSIS — M9903 Segmental and somatic dysfunction of lumbar region: Secondary | ICD-10-CM | POA: Diagnosis not present

## 2023-02-08 DIAGNOSIS — M9904 Segmental and somatic dysfunction of sacral region: Secondary | ICD-10-CM | POA: Diagnosis not present

## 2023-02-08 DIAGNOSIS — M5442 Lumbago with sciatica, left side: Secondary | ICD-10-CM | POA: Diagnosis not present

## 2023-02-11 DIAGNOSIS — M9903 Segmental and somatic dysfunction of lumbar region: Secondary | ICD-10-CM | POA: Diagnosis not present

## 2023-02-11 DIAGNOSIS — M5442 Lumbago with sciatica, left side: Secondary | ICD-10-CM | POA: Diagnosis not present

## 2023-02-11 DIAGNOSIS — M51362 Other intervertebral disc degeneration, lumbar region with discogenic back pain and lower extremity pain: Secondary | ICD-10-CM | POA: Diagnosis not present

## 2023-02-11 DIAGNOSIS — M9904 Segmental and somatic dysfunction of sacral region: Secondary | ICD-10-CM | POA: Diagnosis not present

## 2023-02-12 DIAGNOSIS — M9904 Segmental and somatic dysfunction of sacral region: Secondary | ICD-10-CM | POA: Diagnosis not present

## 2023-02-12 DIAGNOSIS — M51362 Other intervertebral disc degeneration, lumbar region with discogenic back pain and lower extremity pain: Secondary | ICD-10-CM | POA: Diagnosis not present

## 2023-02-12 DIAGNOSIS — M5442 Lumbago with sciatica, left side: Secondary | ICD-10-CM | POA: Diagnosis not present

## 2023-02-12 DIAGNOSIS — M9903 Segmental and somatic dysfunction of lumbar region: Secondary | ICD-10-CM | POA: Diagnosis not present

## 2023-02-25 DIAGNOSIS — M5442 Lumbago with sciatica, left side: Secondary | ICD-10-CM | POA: Diagnosis not present

## 2023-02-25 DIAGNOSIS — M9904 Segmental and somatic dysfunction of sacral region: Secondary | ICD-10-CM | POA: Diagnosis not present

## 2023-02-25 DIAGNOSIS — M51362 Other intervertebral disc degeneration, lumbar region with discogenic back pain and lower extremity pain: Secondary | ICD-10-CM | POA: Diagnosis not present

## 2023-02-25 DIAGNOSIS — M9903 Segmental and somatic dysfunction of lumbar region: Secondary | ICD-10-CM | POA: Diagnosis not present

## 2023-02-28 DIAGNOSIS — H35051 Retinal neovascularization, unspecified, right eye: Secondary | ICD-10-CM | POA: Diagnosis not present

## 2023-03-27 DIAGNOSIS — M9904 Segmental and somatic dysfunction of sacral region: Secondary | ICD-10-CM | POA: Diagnosis not present

## 2023-03-27 DIAGNOSIS — M5442 Lumbago with sciatica, left side: Secondary | ICD-10-CM | POA: Diagnosis not present

## 2023-03-27 DIAGNOSIS — M51362 Other intervertebral disc degeneration, lumbar region with discogenic back pain and lower extremity pain: Secondary | ICD-10-CM | POA: Diagnosis not present

## 2023-03-27 DIAGNOSIS — M9903 Segmental and somatic dysfunction of lumbar region: Secondary | ICD-10-CM | POA: Diagnosis not present

## 2023-03-28 DIAGNOSIS — H353123 Nonexudative age-related macular degeneration, left eye, advanced atrophic without subfoveal involvement: Secondary | ICD-10-CM | POA: Diagnosis not present

## 2023-04-02 DIAGNOSIS — E039 Hypothyroidism, unspecified: Secondary | ICD-10-CM | POA: Diagnosis not present

## 2023-04-02 DIAGNOSIS — M549 Dorsalgia, unspecified: Secondary | ICD-10-CM | POA: Diagnosis not present

## 2023-04-02 DIAGNOSIS — G629 Polyneuropathy, unspecified: Secondary | ICD-10-CM | POA: Diagnosis not present

## 2023-04-02 DIAGNOSIS — I10 Essential (primary) hypertension: Secondary | ICD-10-CM | POA: Diagnosis not present

## 2023-04-17 DIAGNOSIS — M5442 Lumbago with sciatica, left side: Secondary | ICD-10-CM | POA: Diagnosis not present

## 2023-04-17 DIAGNOSIS — M9903 Segmental and somatic dysfunction of lumbar region: Secondary | ICD-10-CM | POA: Diagnosis not present

## 2023-04-17 DIAGNOSIS — M51362 Other intervertebral disc degeneration, lumbar region with discogenic back pain and lower extremity pain: Secondary | ICD-10-CM | POA: Diagnosis not present

## 2023-04-17 DIAGNOSIS — M9904 Segmental and somatic dysfunction of sacral region: Secondary | ICD-10-CM | POA: Diagnosis not present

## 2023-04-24 DIAGNOSIS — M5127 Other intervertebral disc displacement, lumbosacral region: Secondary | ICD-10-CM | POA: Diagnosis not present

## 2023-04-24 DIAGNOSIS — M47816 Spondylosis without myelopathy or radiculopathy, lumbar region: Secondary | ICD-10-CM | POA: Diagnosis not present

## 2023-04-24 DIAGNOSIS — M545 Low back pain, unspecified: Secondary | ICD-10-CM | POA: Diagnosis not present

## 2023-05-04 DIAGNOSIS — J111 Influenza due to unidentified influenza virus with other respiratory manifestations: Secondary | ICD-10-CM | POA: Diagnosis not present

## 2023-05-04 DIAGNOSIS — R509 Fever, unspecified: Secondary | ICD-10-CM | POA: Diagnosis not present

## 2023-05-04 DIAGNOSIS — R0981 Nasal congestion: Secondary | ICD-10-CM | POA: Diagnosis not present

## 2023-05-04 DIAGNOSIS — R051 Acute cough: Secondary | ICD-10-CM | POA: Diagnosis not present

## 2023-05-15 DIAGNOSIS — M9903 Segmental and somatic dysfunction of lumbar region: Secondary | ICD-10-CM | POA: Diagnosis not present

## 2023-05-15 DIAGNOSIS — M9904 Segmental and somatic dysfunction of sacral region: Secondary | ICD-10-CM | POA: Diagnosis not present

## 2023-05-15 DIAGNOSIS — M5442 Lumbago with sciatica, left side: Secondary | ICD-10-CM | POA: Diagnosis not present

## 2023-05-15 DIAGNOSIS — M51362 Other intervertebral disc degeneration, lumbar region with discogenic back pain and lower extremity pain: Secondary | ICD-10-CM | POA: Diagnosis not present

## 2023-05-23 DIAGNOSIS — H353123 Nonexudative age-related macular degeneration, left eye, advanced atrophic without subfoveal involvement: Secondary | ICD-10-CM | POA: Diagnosis not present

## 2023-06-08 DIAGNOSIS — L299 Pruritus, unspecified: Secondary | ICD-10-CM | POA: Diagnosis not present

## 2023-06-08 DIAGNOSIS — L209 Atopic dermatitis, unspecified: Secondary | ICD-10-CM | POA: Diagnosis not present

## 2023-06-08 DIAGNOSIS — L821 Other seborrheic keratosis: Secondary | ICD-10-CM | POA: Diagnosis not present

## 2023-06-12 DIAGNOSIS — M9903 Segmental and somatic dysfunction of lumbar region: Secondary | ICD-10-CM | POA: Diagnosis not present

## 2023-06-12 DIAGNOSIS — M9904 Segmental and somatic dysfunction of sacral region: Secondary | ICD-10-CM | POA: Diagnosis not present

## 2023-06-12 DIAGNOSIS — M5442 Lumbago with sciatica, left side: Secondary | ICD-10-CM | POA: Diagnosis not present

## 2023-06-12 DIAGNOSIS — M51362 Other intervertebral disc degeneration, lumbar region with discogenic back pain and lower extremity pain: Secondary | ICD-10-CM | POA: Diagnosis not present

## 2023-06-20 DIAGNOSIS — M5440 Lumbago with sciatica, unspecified side: Secondary | ICD-10-CM | POA: Diagnosis not present

## 2023-06-20 DIAGNOSIS — M48062 Spinal stenosis, lumbar region with neurogenic claudication: Secondary | ICD-10-CM | POA: Diagnosis not present

## 2023-06-20 DIAGNOSIS — M5126 Other intervertebral disc displacement, lumbar region: Secondary | ICD-10-CM | POA: Diagnosis not present

## 2023-06-28 DIAGNOSIS — M545 Low back pain, unspecified: Secondary | ICD-10-CM | POA: Diagnosis not present

## 2023-07-01 DIAGNOSIS — M545 Low back pain, unspecified: Secondary | ICD-10-CM | POA: Diagnosis not present

## 2023-07-04 DIAGNOSIS — M545 Low back pain, unspecified: Secondary | ICD-10-CM | POA: Diagnosis not present

## 2023-07-08 DIAGNOSIS — M545 Low back pain, unspecified: Secondary | ICD-10-CM | POA: Diagnosis not present

## 2023-07-09 DIAGNOSIS — M48062 Spinal stenosis, lumbar region with neurogenic claudication: Secondary | ICD-10-CM | POA: Diagnosis not present

## 2023-07-09 DIAGNOSIS — M5416 Radiculopathy, lumbar region: Secondary | ICD-10-CM | POA: Diagnosis not present

## 2023-07-12 DIAGNOSIS — M545 Low back pain, unspecified: Secondary | ICD-10-CM | POA: Diagnosis not present

## 2023-07-15 DIAGNOSIS — M545 Low back pain, unspecified: Secondary | ICD-10-CM | POA: Diagnosis not present

## 2023-07-17 DIAGNOSIS — M5442 Lumbago with sciatica, left side: Secondary | ICD-10-CM | POA: Diagnosis not present

## 2023-07-17 DIAGNOSIS — M51362 Other intervertebral disc degeneration, lumbar region with discogenic back pain and lower extremity pain: Secondary | ICD-10-CM | POA: Diagnosis not present

## 2023-07-17 DIAGNOSIS — M9903 Segmental and somatic dysfunction of lumbar region: Secondary | ICD-10-CM | POA: Diagnosis not present

## 2023-07-17 DIAGNOSIS — M9904 Segmental and somatic dysfunction of sacral region: Secondary | ICD-10-CM | POA: Diagnosis not present

## 2023-07-18 DIAGNOSIS — H353123 Nonexudative age-related macular degeneration, left eye, advanced atrophic without subfoveal involvement: Secondary | ICD-10-CM | POA: Diagnosis not present

## 2023-07-19 DIAGNOSIS — M545 Low back pain, unspecified: Secondary | ICD-10-CM | POA: Diagnosis not present

## 2023-07-22 DIAGNOSIS — M545 Low back pain, unspecified: Secondary | ICD-10-CM | POA: Diagnosis not present

## 2023-07-26 DIAGNOSIS — M545 Low back pain, unspecified: Secondary | ICD-10-CM | POA: Diagnosis not present

## 2023-07-29 DIAGNOSIS — M545 Low back pain, unspecified: Secondary | ICD-10-CM | POA: Diagnosis not present

## 2023-08-05 DIAGNOSIS — M545 Low back pain, unspecified: Secondary | ICD-10-CM | POA: Diagnosis not present

## 2023-08-08 DIAGNOSIS — M5440 Lumbago with sciatica, unspecified side: Secondary | ICD-10-CM | POA: Diagnosis not present

## 2023-08-09 DIAGNOSIS — M545 Low back pain, unspecified: Secondary | ICD-10-CM | POA: Diagnosis not present

## 2023-08-16 DIAGNOSIS — M545 Low back pain, unspecified: Secondary | ICD-10-CM | POA: Diagnosis not present

## 2023-08-23 DIAGNOSIS — M545 Low back pain, unspecified: Secondary | ICD-10-CM | POA: Diagnosis not present

## 2023-09-12 DIAGNOSIS — H353123 Nonexudative age-related macular degeneration, left eye, advanced atrophic without subfoveal involvement: Secondary | ICD-10-CM | POA: Diagnosis not present

## 2023-09-25 DIAGNOSIS — C44319 Basal cell carcinoma of skin of other parts of face: Secondary | ICD-10-CM | POA: Diagnosis not present

## 2023-09-25 DIAGNOSIS — L570 Actinic keratosis: Secondary | ICD-10-CM | POA: Diagnosis not present

## 2023-09-25 DIAGNOSIS — L821 Other seborrheic keratosis: Secondary | ICD-10-CM | POA: Diagnosis not present

## 2023-09-25 DIAGNOSIS — L814 Other melanin hyperpigmentation: Secondary | ICD-10-CM | POA: Diagnosis not present

## 2023-10-10 DIAGNOSIS — C44319 Basal cell carcinoma of skin of other parts of face: Secondary | ICD-10-CM | POA: Diagnosis not present

## 2023-11-02 DIAGNOSIS — L209 Atopic dermatitis, unspecified: Secondary | ICD-10-CM | POA: Diagnosis not present

## 2023-11-02 DIAGNOSIS — L299 Pruritus, unspecified: Secondary | ICD-10-CM | POA: Diagnosis not present

## 2023-11-02 DIAGNOSIS — L820 Inflamed seborrheic keratosis: Secondary | ICD-10-CM | POA: Diagnosis not present

## 2023-11-07 DIAGNOSIS — L200 Besnier's prurigo: Secondary | ICD-10-CM | POA: Diagnosis not present

## 2023-11-07 DIAGNOSIS — R531 Weakness: Secondary | ICD-10-CM | POA: Diagnosis not present

## 2023-11-07 DIAGNOSIS — H353123 Nonexudative age-related macular degeneration, left eye, advanced atrophic without subfoveal involvement: Secondary | ICD-10-CM | POA: Diagnosis not present

## 2023-11-07 DIAGNOSIS — Z79899 Other long term (current) drug therapy: Secondary | ICD-10-CM | POA: Diagnosis not present

## 2023-11-07 DIAGNOSIS — Z111 Encounter for screening for respiratory tuberculosis: Secondary | ICD-10-CM | POA: Diagnosis not present

## 2023-11-20 DIAGNOSIS — Z0000 Encounter for general adult medical examination without abnormal findings: Secondary | ICD-10-CM | POA: Diagnosis not present

## 2023-11-20 DIAGNOSIS — M4726 Other spondylosis with radiculopathy, lumbar region: Secondary | ICD-10-CM | POA: Diagnosis not present

## 2023-11-20 DIAGNOSIS — Z1231 Encounter for screening mammogram for malignant neoplasm of breast: Secondary | ICD-10-CM | POA: Diagnosis not present

## 2023-11-20 DIAGNOSIS — E039 Hypothyroidism, unspecified: Secondary | ICD-10-CM | POA: Diagnosis not present

## 2023-11-20 DIAGNOSIS — F5101 Primary insomnia: Secondary | ICD-10-CM | POA: Diagnosis not present

## 2023-11-20 DIAGNOSIS — Z6824 Body mass index (BMI) 24.0-24.9, adult: Secondary | ICD-10-CM | POA: Diagnosis not present

## 2023-11-20 DIAGNOSIS — I10 Essential (primary) hypertension: Secondary | ICD-10-CM | POA: Diagnosis not present

## 2023-11-20 DIAGNOSIS — Z1331 Encounter for screening for depression: Secondary | ICD-10-CM | POA: Diagnosis not present

## 2023-11-20 DIAGNOSIS — Z79899 Other long term (current) drug therapy: Secondary | ICD-10-CM | POA: Diagnosis not present

## 2023-12-10 DIAGNOSIS — M5416 Radiculopathy, lumbar region: Secondary | ICD-10-CM | POA: Diagnosis not present

## 2024-01-02 DIAGNOSIS — H353123 Nonexudative age-related macular degeneration, left eye, advanced atrophic without subfoveal involvement: Secondary | ICD-10-CM | POA: Diagnosis not present

## 2024-01-04 DIAGNOSIS — L299 Pruritus, unspecified: Secondary | ICD-10-CM | POA: Diagnosis not present

## 2024-01-04 DIAGNOSIS — L570 Actinic keratosis: Secondary | ICD-10-CM | POA: Diagnosis not present

## 2024-01-04 DIAGNOSIS — L209 Atopic dermatitis, unspecified: Secondary | ICD-10-CM | POA: Diagnosis not present

## 2024-01-15 NOTE — Progress Notes (Unsigned)
 [facility] [address] [place]. [place] [address] [phone]       OFFICE FOLLOW UP NOTE  [name] Date of Birth:  Nov 26, 1930 Medical Record Number:  [id number]   Referring MD:  Reena Beck, NP  Reason for Referral:  Sensory neuropathy   No chief complaint on file.     HPI:   Update 01/16/2024 JM: Patient returns for 1 year follow-up after prior visit with Dr. Rosemarie.  At prior visit, started on topiramate  25 mg twice daily in addition to gabapentin  300/300/600 due to continued pain and difficulty tolerating higher dose of gabapentin .    She was reevaluated by Dr. Darlis and underwent ESI with significant improvement of lumbar radiculopathy.      History provided for reference purposes only Update 01/14/2023 Dr. Rosemarie: She returns for follow-up after last visit a year ago.  She is accompanied by Patricia Mason.  She continues to have cramps in Patricia hands and feet particularly after she is use them.  She continues to take gabapentin  4 tablets a day and has not been able to tolerate a higher dose even though she feels it is helping.  She has not tried any other medications.  She continues to have numbness in Patricia hands and feet and some difficulty with fine motor skills.  She continues to otherwise do well and is independent actives of daily living.  She is careful with Patricia walking and and has not had any falls or injuries.  She walks about 30 minutes a day.  She does have chronic low low back pain which is bothering Patricia recently.  She has no new complaints.  Update 01/01/2022 Dr. Rosemarie: She returns for follow-up after last visit in December 2022.  She is accompanied by Patricia Mason today.  Patient continues to have cramps in Patricia hands and feet.  These occur mostly at night 2 weeks or so.  They involve the lateral aspect of Patricia legs and she is able to walk without.  Remains on gabapentin  300 mg morning, 300 and at night.  He has not been able to tolerate a  higher dose in the past.  He also complains of a pulling sensation in the hand and wonders if this is related to Patricia arthritis.  She ambulates with a cane states she is doing well.  She exercises at least 30 minutes by walking every day.  She has had no recent falls or injuries.  She states Patricia blood pressure is under good control though today it is elevated 160/70.  She is tolerating well without bruising or bleeding.  Update 02/23/2021 JM: Returns for follow-up after prior visit over 1 year ago accompanied by Patricia Mason  C/o nocturnal leg cramps R>L usually on typically located anterior and lateral muscles distally. Occurs 1-2x per week but will awaken Patricia from Patricia sleep after changing position.  After ambulating, seems to subside but will restart after laying back down. These are painful - more than just a discomfort.  C/o left hand cramping after certain movement such as styling Patricia hair. Typically will not last long  Mild progression of neuropathy with slight increase of numbness in feet bilaterally but continues to deny any nerve pain sensation.  She has remained on gabapentin  300/300/600 -denies side effects.  No further concerns at this time  Update 12/24/2019 JM: Patricia Mason returns for 47-month follow-up accompanied by Patricia Mason for sensory neuropathy.  Neuropathy has been stable since prior visit with ongoing  use of gabapentin  300/300/600 tolerating dosage well without side effects.  She continues to have numbness in Patricia feet and right leg cramping (present since knee surgery) but denies any painful symptoms such as burning, pinprick or electrical shock sensations.  Continues to use a cane for ambulation and denies any recent falls.  She does report right pointer finger catching sensation with at times wanting to hyperextend.  Reports occasional cramping sensation but otherwise denies pain.  She is unsure if related to arthritis as she has extensive bilateral hand arthritis and inability to  fully flex right pointer finger which is chronic.  She continues to follow with Dr. Onetha for chronic lumbar disease.  No further concerns.  Update 06/22/2019 Dr. Rosemarie : She returns for follow-up after last visit with me 12 months ago.  She is accompanied by Patricia Mason.  Patient states she did not tolerate increasing the gabapentin  to 600 times daily as it made Patricia have some palpitations as well as she developed constipation.  She reduce the dose back to 4 tablets a day and is doing better.  She still has numbness in the feet particularly when she is on Patricia feet and has been walking for some time.  She denies burning or severe pain.  She states she is gotten used to this.  She was previously not able to tolerate Topamax  due to side effects also. She does not want to try Lyrica or alternative agent at this time.  She underwent EMG nerve conduction study done 04/06/2019 by Dr. Jenel which confirm sensory peripheral polyneuropathy.  She is has been bothered by back problems with a pinched nerve and she does see Dr. Onetha who operated on Patricia neck in the past.  She also is planning on having MRI of Patricia back done.  Update 03/31/2019 Dr. Rosemarie:  Patient returns for follow-up after last visit 6 months ago.  She is accompanied by Patricia Mason.  She states she was not able to tolerate Topamax  which is started at last visit because of dizziness.  She was only on a small dose of 50 mg twice daily and discontinued it.  She remains on gabapentin  and the current dose of 300 mg 3 times daily which is tolerating well and states she is never been on a higher dose but still complains of significant paresthesias and numbness in Patricia feet.  This is constant and bothersome.  She had neuropathy panel labs done on 09/15/2018 all of which were normal.  I had requested the EMG nerve conduction study but for unclear reason that has not yet been scheduled.  She did have surgery for left knee replacement on 10/13/2018 which went well.  She has no new  complaints today.  I am yet to receive records from Dr. Arley Onetha and Dr. [place]'s office   Initial visit 88/29/20 Dr. Rosemarie : Ms. Predmore is a 89 year old pleasant Caucasian lady seen today for initial office consultation visit for neuropathy.  History is obtained from the patient as well as referral notes and from Patricia Mason was present for this visit.  She has a past medical history of hypertension, degenerative disc disease, hypothyroidism basal cell carcinoma of the nose.  States she has a 10-year history of sensory peripheral neuropathy.  She describes this as discomfort in Patricia feet from the ankle down bilaterally.  Over the last 6 years she was being followed at Dr. Ozell [place]'s neurology clinic in [place] but for the last couple of years since he left  this practice she has had no neurological follow-up.  She apparently had a EMG nerve conduction study done in September 2015 which showed absent sural sensory responses but I do not have that report to review.  She states in the last 3 months or so she is noticed increased and worsening particularly in Patricia right leg.  She describes discomfort now present throughout the day but more noticeable when she is is on Patricia feet or at night.  At times she has trouble sleeping.  She is currently taking Neurontin  300 mg 3 times daily which previously used to work well but there is no longer keeping Patricia discomfort under control.  She also admits to balance being slightly off however she has had no major falls.  She can get by indoors without any help but does use a cane for outdoors.  She was recently seen by Dr. Arley cram neurosurgeon who apparently did some work-up for degenerative back disease but I do not have his records for my review today.  Similarly I have not seen Dr. [place]'s records for my review today as well.  Patient also has some numbness in Patricia hands but they are it is not as bad.  She denies significant weakness in the hands or diminished fine  motor skills.  She does not trip or fall.  She has not tried other medications besides gabapentin  for neuropathic pain.  She denies any recent loss of weight, decreased appetite, fever or chronic cough.  She denies history of diabetes or heavy alcohol  intake    ROS:   14 system review of systems is positive for those listed in HPI and all other systems negative  PMH:  Past Medical History:  Diagnosis Date   Arthritis    Cancer (HCC)    Skin-Squamous cell   Complication of anesthesia    Hypertension    Hyperthyroidism    Hypothyroidism    Iron deficiency anemia    Neuropathy    PONV (postoperative nausea and vomiting)    Vitamin B 12 deficiency     Social History:  Social History   Socioeconomic History   Marital status: Widowed    Spouse name: Not on file   Number of children: Not on file   Years of education: Not on file   Highest education level: Not on file  Occupational History   Not on file  Tobacco Use   Smoking status: Never   Smokeless tobacco: Never  Vaping Use   Vaping status: Never Used  Substance and Sexual Activity   Alcohol  use: Never   Drug use: Never   Sexual activity: Not on file  Other Topics Concern   Not on file  Social History Narrative   Not on file   Social Drivers of Health   Financial Resource Strain: Not on file  Food Insecurity: Not on file  Transportation Needs: Not on file  Physical Activity: Not on file  Stress: Not on file  Social Connections: Not on file  Intimate Partner Violence: Not on file    Medications:   Current Outpatient Medications on File Prior to Visit  Medication Sig Dispense Refill   aspirin  325 MG EC tablet Take 1 tablet (325 mg total) by mouth 2 (two) times daily. 30 tablet 0   Biotin 5000 MCG TABS Take 5,000 mcg by mouth daily.      Calcium Carb-Cholecalciferol (CALCIUM 600+D3 PO) Take 1 tablet by mouth daily.     gabapentin  (NEURONTIN ) 300 MG capsule TAKE  1 CAPSULE BY MOUTH IN THE MORNING, 1 CAPSULE IN  THE AFTERNOON, AND 2 CAPSULES IN THE EVENING. 360 capsule 3   levothyroxine  (SYNTHROID , LEVOTHROID) 88 MCG tablet Take 88 mcg by mouth daily before breakfast.     MYCOPHENOLATE MOFETIL PO Take 500 mg by mouth 3 (three) times daily.     Omega-3 Fatty Acids (FISH OIL) 1000 MG CAPS Take 1,000 mg by mouth daily.     topiramate  (TOPAMAX ) 25 MG tablet Take 1 tablet (25 mg total) by mouth 2 (two) times daily. 120 tablet 3   triamterene -hydrochlorothiazide  (MAXZIDE -25) 37.5-25 MG tablet Take 1 tablet by mouth daily.      vitamin B-12 (CYANOCOBALAMIN) 1000 MCG tablet Take 1,000 mcg by mouth daily.     No current facility-administered medications on file prior to visit.    Allergies:   Allergies  Allergen Reactions   Prednisone Hives   Sulfa Antibiotics Anaphylaxis and Rash    Throat swelling/fever    Physical Exam There were no vitals filed for this visit.   There is no height or weight on file to calculate BMI.  General: frail  pleasant elderly Caucasian lady, seated, in no evident distress Head: head normocephalic and atraumatic.   Neck: supple with no carotid or supraclavicular bruits.  Surgical scar from prior thyroid  surgery in the neck Cardiovascular: regular rate and rhythm, no murmurs Musculoskeletal: Bilateral hand arthritic nodules Skin:  no rash/petichiae Vascular:  Normal pulses all extremities  Neurologic Exam Mental Status: Awake and fully alert.  Fluent speech and language.  Oriented to place and time. Recent and remote memory intact. Attention span, concentration and fund of knowledge appropriate. Mood and affect appropriate.  Cranial Nerves: pupils equal, briskly reactive to light. Extraocular movements full without nystagmus. Visual fields full to confrontation. Hearing intact. Facial sensation intact. Face, tongue, palate moves normally and symmetrically.  Motor: Normal bulk and tone. Normal strength in all tested extremity muscles. Sensory.: diminished vibratory and  light touch sensation bilaterally from knee down bilaterally Coordination: Rapid alternating movements normal in all extremities. Finger-to-nose and heel-to-shin performed accurately bilaterally. Gait and Station: Arises from chair without difficulty. Stance is slightly hunched. Gait demonstrates decreased stride length and step height with mild unsteadiness without use of assistive device.  She is unable to heel, toe and tandem walk  .  Reflexes: 1+ and symmetric except both ankle jerks are absent.. Toes downgoing.     ASSESSMENT/PLAN: 8 lady with chronic sensory peripheral neuropathy with pesrsistent  lower extremity paresthesias and cramps  of unclear etiology.  Also chronic c/o R>L LE nocturnal cramping       I had a long discussion with the patient and Patricia Mason about Patricia chronic neuropathy and extremity cramps which appear stable but yet bothersome.  I recommend she continue gabapentin  300 mg, 300 mg in the afternoon and 600 mg at bedtime.  He has had trouble tolerating higher doses in the past.  Trial of Topamax  25 mg twice daily for help with cramps and paresthesias.I encouraged Patricia to drink plenty of fluids eat potassium containing foods.  I also encouraged Patricia to discuss with primary care physician on switching Patricia Maxide to alternative blood pressure occasions as this may be contributing to cramps.  She was encouraged to use a cane while walking outdoors and long distances discussed fall prevention precautions.  Return for follow-up in the future in a year with my nurse    practitioner or call earlier if necessary. I spent of face-to-face  and non-face-to-face time with patient and Mason  This included previsit chart review, lab review, study review, order entry, electronic health record documentation, patient and husband education and discussion regarding sensory neuropathy with ongoing use of gabapentin , nocturnal cramping and answered all Patricia questions to patient and husband's  satisfaction  I personally spent a total of *** minutes in the care of the patient today including {Time Based Coding:210964241}.  Harlene Bogaert, AGNP-BC  [facility] [address] [place], [place] [address]  Phone [phone] Fax [phone] Note: This document was prepared with digital dictation and possible smart phrase technology. Any transcriptional errors that result from this process are unintentional.

## 2024-01-16 ENCOUNTER — Encounter: Payer: Self-pay | Admitting: Adult Health

## 2024-01-16 ENCOUNTER — Ambulatory Visit: Payer: PPO | Admitting: Adult Health

## 2024-01-16 VITALS — BP 139/75 | HR 87 | Ht 64.0 in | Wt 140.0 lb

## 2024-01-16 DIAGNOSIS — G629 Polyneuropathy, unspecified: Secondary | ICD-10-CM | POA: Diagnosis not present

## 2024-01-16 DIAGNOSIS — R252 Cramp and spasm: Secondary | ICD-10-CM

## 2024-01-16 MED ORDER — GABAPENTIN 300 MG PO CAPS: ORAL_CAPSULE | ORAL | 3 refills | Status: AC

## 2024-01-16 NOTE — Patient Instructions (Signed)
 Your Plan:  Continue gabapentin  at current dosage  You can try Nervive over the counter supplement which can help with nerve pain  You can also try topical ointments such as muscle rub creams and Salon pas   If you are interested in adding additional medication, we can consider adding Cymbalta - please let me know  Using good orthotics can help with neuropathy - you can look at [facility]      Follow up in 1 year or call earlier if needed     Thank you for coming to see us  at [facility]. I hope we have been able to provide you high quality care today.  You may receive a patient satisfaction survey over the next few weeks. We would appreciate your feedback and comments so that we may continue to improve ourselves and the health of our patients.

## 2024-02-07 DIAGNOSIS — Z1231 Encounter for screening mammogram for malignant neoplasm of breast: Secondary | ICD-10-CM | POA: Diagnosis not present

## 2024-02-20 DIAGNOSIS — H353123 Nonexudative age-related macular degeneration, left eye, advanced atrophic without subfoveal involvement: Secondary | ICD-10-CM | POA: Diagnosis not present

## 2025-01-21 ENCOUNTER — Ambulatory Visit: Admitting: Adult Health
